# Patient Record
Sex: Female | Born: 2000 | Race: Black or African American | Hispanic: No | Marital: Single | Smoking: Current every day smoker
Health system: Southern US, Community
[De-identification: ages and names within clinical notes are randomized; demographics above are authoritative.]

## PROBLEM LIST (undated history)

## (undated) DIAGNOSIS — H539 Unspecified visual disturbance: Secondary | ICD-10-CM

## (undated) DIAGNOSIS — J45909 Unspecified asthma, uncomplicated: Secondary | ICD-10-CM

## (undated) DIAGNOSIS — J189 Pneumonia, unspecified organism: Secondary | ICD-10-CM

## (undated) DIAGNOSIS — J302 Other seasonal allergic rhinitis: Secondary | ICD-10-CM

## (undated) DIAGNOSIS — F909 Attention-deficit hyperactivity disorder, unspecified type: Secondary | ICD-10-CM

---
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
## 2022-01-22 DIAGNOSIS — Z419 Encounter for procedure for purposes other than remedying health state, unspecified: Secondary | ICD-10-CM | POA: Diagnosis not present

## 2022-01-29 ENCOUNTER — Ambulatory Visit
Admission: EM | Admit: 2022-01-29 | Discharge: 2022-01-29 | Disposition: A | Discharge status: Home / Self Care | Payer: Commercial Managed Care - PPO | Attending: Urgent Care | Admitting: Urgent Care

## 2022-01-29 DIAGNOSIS — N898 Other specified noninflammatory disorders of vagina: Secondary | ICD-10-CM | POA: Diagnosis not present

## 2022-01-29 DIAGNOSIS — Z20828 Contact with and (suspected) exposure to other viral communicable diseases: Secondary | ICD-10-CM | POA: Diagnosis not present

## 2022-01-29 DIAGNOSIS — Z202 Contact with and (suspected) exposure to infections with a predominantly sexual mode of transmission: Secondary | ICD-10-CM | POA: Diagnosis not present

## 2022-01-29 DIAGNOSIS — R300 Dysuria: Secondary | ICD-10-CM | POA: Insufficient documentation

## 2022-01-29 DIAGNOSIS — Z1152 Encounter for screening for COVID-19: Secondary | ICD-10-CM | POA: Insufficient documentation

## 2022-01-29 LAB — POCT URINALYSIS DIP (MANUAL ENTRY)
Bilirubin, UA: NEGATIVE
Glucose, UA: NEGATIVE mg/dL
Leukocytes, UA: NEGATIVE
Nitrite, UA: NEGATIVE
Spec Grav, UA: 1.025 (ref 1.010–1.025)
Urobilinogen, UA: 0.2 E.U./dL
pH, UA: 5.5 (ref 5.0–8.0)

## 2022-01-29 LAB — RESP PANEL BY RT-PCR (RSV, FLU A&B, COVID)  RVPGX2
Influenza A by PCR: NEGATIVE
Influenza B by PCR: NEGATIVE
Resp Syncytial Virus by PCR: NEGATIVE
SARS Coronavirus 2 by RT PCR: NEGATIVE

## 2022-01-29 NOTE — ED Provider Notes (Signed)
[name]    CSN: [id number] Arrival date & time: 01/29/22  0626      History   Chief Complaint Chief Complaint  Patient presents with   RSV/COVID/FLU testing    Vaginal Itching   vaginal odor    Dysuria    HPI Amy Mcclure is a 21 y.o. female.    Vaginal Itching  Dysuria   This to urgent care with complaint of painful urination, vaginal odor and itching x 2 weeks. Denies any vaginal discharge.  Patient also reports recent exposure to RSV and flu but endorsing no symptoms.  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      Home Medications    Prior to Admission medications   Medication Sig Start Date End Date Taking? Authorizing Provider  albuterol (PROVENTIL HFA;VENTOLIN HFA) 108 (90 BASE) MCG/ACT inhaler Inhale 2 puffs into the lungs  every 6 (six) hours as needed (seasonal allergies). 06/24/14   Chauncey Mann, MD  ARIPiprazole (ABILIFY) 10 MG tablet Take by mouth. 05/19/21 08/17/21  [provider]  beclomethasone (QVAR) 80 MCG/ACT inhaler Inhale 1 puff into the lungs 2 (two) times daily. 06/24/14   Chauncey Mann, MD  cetirizine (ZYRTEC) 10 MG tablet Take by mouth.    [provider]  desonide (DESOWEN) 0.05 % ointment Apply 1 application topically 2 (two) times daily. 06/24/14   Chauncey Mann, MD  FLOVENT HFA 44 MCG/ACT inhaler Inhale into the lungs. 08/21/21   [provider]  fluocinonide cream (LIDEX) 0.05 % Apply 1 application topically 2 (two) times daily as needed (eczema). 06/24/14   Chauncey Mann, MD  lithium carbonate (LITHOBID) 300 MG CR tablet Take 3 tablets (900 mg total) by mouth at bedtime. 06/24/14   Chauncey Mann, MD  polyethylene glycol (MIRALAX / GLYCOLAX) 17 g packet Take by mouth. 02/25/21   [provider]    Family History Family History  Problem Relation Age of Onset   Hypertension Mother    Miscarriages / [place] Mother    Asthma Father    Asthma Brother    Heart disease Maternal Aunt    Diabetes Maternal Aunt    Heart disease Maternal Uncle    Diabetes Paternal Aunt    Arthritis Maternal Grandmother    Heart disease Maternal Grandmother  Hypertension Paternal Grandmother    Diabetes Paternal Grandmother    Hypertension Paternal Grandfather     Social History Social History   Tobacco Use   Smoking status: Every Day    Types: Cigars   Smokeless tobacco: Never  Vaping Use   Vaping Use: Every day  Substance Use Topics   Alcohol use: No   Drug use: No     Allergies   Eggs or egg-derived products, Shellfish allergy, Cashew nut oil, Manganese, Milk-related compounds, Other, Penicillins, Barley grass, and Red dye   Review of Systems Review of Systems  Genitourinary:  Positive for dysuria.     Physical Exam Triage Vital  Signs ED Triage Vitals  Enc Vitals Group     BP 01/29/22 0843 115/73     Pulse Rate 01/29/22 0843 99     Resp 01/29/22 0843 18     Temp 01/29/22 0843 97.7 F (36.5 C)     Temp src --      SpO2 01/29/22 0843 98 %     Weight --      Height --      Head Circumference --      Peak Flow --      Pain Score 01/29/22 0846 0     Pain Loc --      Pain Edu? --      Excl. in [place]? --    No data found.  Updated Vital Signs BP 115/73   Pulse 99   Temp 97.7 F (36.5 C)   Resp 18   LMP 12/23/2021 (Approximate)   SpO2 98%   Visual Acuity Right Eye Distance:   Left Eye Distance:   Bilateral Distance:    Right Eye Near:   Left Eye Near:    Bilateral Near:     Physical Exam Vitals reviewed.  Constitutional:      Appearance: Normal appearance.  Skin:    General: Skin is warm and dry.  Neurological:     General: No focal deficit present.     Mental Status: She is alert and oriented to person, place, and time.  Psychiatric:        Mood and Affect: Mood normal.        Behavior: Behavior normal.      UC Treatments / Results  Labs (all labs ordered are listed, but only abnormal results are displayed) Labs Reviewed  POCT URINALYSIS DIP (MANUAL ENTRY)  CERVICOVAGINAL ANCILLARY ONLY    EKG   Radiology No results found.  Procedures Procedures (including critical care time)  Medications Ordered in UC Medications - No data to display  Initial Impression / Assessment and Plan / UC Course  I have reviewed the triage vital signs and the nursing notes.  Pertinent labs & imaging results that were available during my care of the patient were reviewed by me and considered in my medical decision making (see chart for details).   Vaginal swab is obtained and results are pending.  Patient is notified that she will receive results and possible treatment tomorrow at earliest.  UA indicates negative leukocytes or nitrites.  Small blood and ketones are present.  Will discharge  patient waiting on results of vaginal swab.   Final Clinical Impressions(s) / UC Diagnoses   Final diagnoses:  STD exposure   Discharge Instructions   None    ED Prescriptions   None    PDMP not reviewed this encounter.   Charma Igo, [place] 01/29/22 [phone]

## 2022-01-29 NOTE — ED Triage Notes (Signed)
Pt. Presents to UC w/ c/o dysuria, vaginal odor and vaginal itching for the past 2 weeks. Pt. Also states she was recently exposed to RSV and the FLU but endorses no symptoms.

## 2022-01-29 NOTE — Discharge Instructions (Signed)
Your urine does not indicate any signs of urinary tract infection.  The results of your swab will be posted to your MyChart account tomorrow.  If positive, someone will contact you to arrange treatment.

## 2022-02-01 ENCOUNTER — Telehealth (HOSPITAL_COMMUNITY): Payer: Self-pay | Admitting: Emergency Medicine

## 2022-02-01 LAB — CERVICOVAGINAL ANCILLARY ONLY
Bacterial Vaginitis (gardnerella): POSITIVE — AB
Candida Glabrata: NEGATIVE
Candida Vaginitis: NEGATIVE
Chlamydia: POSITIVE — AB
Comment: NEGATIVE
Comment: NEGATIVE
Comment: NEGATIVE
Comment: NEGATIVE
Comment: NEGATIVE
Comment: NORMAL
Neisseria Gonorrhea: NEGATIVE
Trichomonas: NEGATIVE

## 2022-02-01 MED ORDER — METRONIDAZOLE 500 MG PO TABS: 500.0000 mg | ORAL_TABLET | Freq: Two times a day (BID) | ORAL | 0 refills | Status: DC

## 2022-02-01 MED ORDER — DOXYCYCLINE HYCLATE 100 MG PO CAPS: 100.0000 mg | ORAL_CAPSULE | Freq: Two times a day (BID) | ORAL | 0 refills | Status: AC

## 2022-02-22 DIAGNOSIS — Z419 Encounter for procedure for purposes other than remedying health state, unspecified: Secondary | ICD-10-CM | POA: Diagnosis not present

## 2022-02-23 ENCOUNTER — Ambulatory Visit
Admission: EM | Admit: 2022-02-23 | Discharge: 2022-02-23 | Disposition: A | Discharge status: Home / Self Care | Payer: Commercial Managed Care - PPO | Attending: Nurse Practitioner | Admitting: Nurse Practitioner

## 2022-02-23 DIAGNOSIS — N760 Acute vaginitis: Secondary | ICD-10-CM | POA: Insufficient documentation

## 2022-02-23 DIAGNOSIS — R300 Dysuria: Secondary | ICD-10-CM | POA: Diagnosis not present

## 2022-02-23 DIAGNOSIS — N3000 Acute cystitis without hematuria: Secondary | ICD-10-CM | POA: Insufficient documentation

## 2022-02-23 LAB — POCT URINALYSIS DIP (MANUAL ENTRY)
Bilirubin, UA: NEGATIVE
Blood, UA: NEGATIVE
Glucose, UA: NEGATIVE mg/dL
Nitrite, UA: NEGATIVE
Spec Grav, UA: 1.025 (ref 1.010–1.025)
Urobilinogen, UA: 0.2 E.U./dL
pH, UA: 6.5 (ref 5.0–8.0)

## 2022-02-23 LAB — POCT URINE PREGNANCY: Preg Test, Ur: NEGATIVE

## 2022-02-23 MED ORDER — FLUCONAZOLE 150 MG PO TABS: 150.0000 mg | ORAL_TABLET | Freq: Every day | ORAL | 0 refills | Status: AC

## 2022-02-23 MED ORDER — NITROFURANTOIN MONOHYD MACRO 100 MG PO CAPS: 100.0000 mg | ORAL_CAPSULE | Freq: Two times a day (BID) | ORAL | 0 refills | Status: AC

## 2022-02-23 NOTE — Discharge Instructions (Signed)
Will contact you with results of the testing done today if the results are positive Start Macrobid twice a day for 7 days Diflucan as prescribed.  May repeat dose in 3 days if your symptoms do not improve Rest and fluids Follow-up with your PCP 2 to 3 days for recheck Please go to the emergency room if you have any worsening symptoms

## 2022-02-23 NOTE — ED Triage Notes (Signed)
Patient presents to [facility] for STD testing. She reports she tested positive for chlamydia and BV. States her symptoms have worsened after completing medications.  She c/o of dysuria, burning to rectum area, vaginal discharge with odor.

## 2022-02-23 NOTE — ED Provider Notes (Signed)
Roderic Palau    CSN: [id number] Arrival date & time: 02/23/22  1849      History   Chief Complaint Chief Complaint  Patient presents with   SEXUALLY TRANSMITTED DISEASE   Dysuria   Vaginal Discharge    HPI Amy Mcclure is a 22 y.o. female who presents for evaluation of dysuria and vaginal discharge. Patient was seen in urgent care on 12/8 for vaginal discharge.  She was positive for chlamydia and BV.  She was treated with doxycycline and metronidazole.  States she had 1 day of resolution of the symptoms and then developed vaginal discharge and dysuria.  Discharge is a malodorous, pruritic, thick white discharge.  Reports urinary burning but denies frequency, urgency, hematuria, fevers, nausea/vomiting, flank pain.  No STD or exposure.  Patient has a history of vaginal infections such as yeast infection or BV in the past. Patient has used nothing OTC medications for symptoms. Pt has no other concerns at this time.    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      Home Medications    Prior to Admission medications   Medication Sig Start Date End Date Taking? Authorizing Provider  fluconazole (DIFLUCAN) 150 MG tablet Take 1 tablet (150 mg total) by mouth daily for 2 doses. May repeat dose in 3 days if symptoms needed 02/23/22 02/25/22 Yes Melynda Ripple, NP  nitrofurantoin, macrocrystal-monohydrate, (MACROBID) 100 MG capsule Take 1 capsule (100 mg total) by mouth 2 (two) times daily for 7 days. 02/23/22 03/02/22 Yes Melynda Ripple, NP  albuterol (PROVENTIL HFA;VENTOLIN HFA) 108 (90 BASE) MCG/ACT inhaler Inhale 2 puffs into the lungs every 6 (six) hours as needed (seasonal allergies). 06/24/14   Delight Hoh, MD  ARIPiprazole (ABILIFY) 10 MG tablet Take by mouth. 05/19/21 08/17/21  [provider]  beclomethasone (QVAR) 80 MCG/ACT inhaler Inhale 1 puff into the lungs 2 (two) times daily. 06/24/14   Delight Hoh, MD  cetirizine (ZYRTEC) 10 MG tablet Take by mouth.    [provider]  desonide (DESOWEN) 0.05 % ointment Apply 1 application topically 2 (two) times daily. 06/24/14   Delight Hoh, MD  FLOVENT HFA 44 MCG/ACT inhaler Inhale into the lungs. 08/21/21   [provider]  fluocinonide cream (LIDEX) 4.78 % Apply 1 application topically 2 (two) times daily as needed (  eczema). 06/24/14   Chauncey Mann, MD  lithium carbonate (LITHOBID) 300 MG CR tablet Take 3 tablets (900 mg total) by mouth at bedtime. 06/24/14   Chauncey Mann, MD  metroNIDAZOLE (FLAGYL) 500 MG tablet Take 1 tablet (500 mg total) by mouth 2 (two) times daily. 02/01/22   Lamptey, Britta Mccreedy, MD  polyethylene glycol (MIRALAX / GLYCOLAX) 17 g packet Take by mouth. 02/25/21   [provider]    Family History Family History  Problem Relation Age of  Onset   Hypertension Mother    Miscarriages / [place] Mother    Asthma Father    Asthma Brother    Heart disease Maternal Aunt    Diabetes Maternal Aunt    Heart disease Maternal Uncle    Diabetes Paternal Aunt    Arthritis Maternal Grandmother    Heart disease Maternal Grandmother    Hypertension Paternal Grandmother    Diabetes Paternal Grandmother    Hypertension Paternal Grandfather     Social History Social History   Tobacco Use   Smoking status: Every Day    Types: Cigars   Smokeless tobacco: Never  Vaping Use   Vaping Use: Every day  Substance Use Topics   Alcohol use: No   Drug use: No     Allergies   Eggs or egg-derived products, Shellfish allergy, Cashew nut oil, Manganese, Milk-related compounds, Other, Penicillins, Barley grass, and Red dye   Review of Systems Review of Systems  Genitourinary:  Positive for dysuria and vaginal discharge.     Physical Exam Triage Vital Signs ED Triage Vitals [02/23/22 1912]  Enc Vitals Group     BP      Pulse      Resp      Temp      Temp src      SpO2      Weight      Height      Head Circumference      Peak Flow      Pain Score 0     Pain Loc      Pain Edu?      Excl. in [place]?    No data found.  Updated Vital Signs BP 113/76 (BP Location: Left Arm)   Pulse 94   Temp 98.9 F (37.2 C) (Oral)   Resp 16   LMP 12/23/2021 (Approximate) Comment: patient is on birth control  SpO2 97%   Visual Acuity Right Eye Distance:   Left Eye Distance:   Bilateral Distance:    Right Eye Near:   Left Eye Near:    Bilateral Near:     Physical Exam Vitals and nursing note reviewed.  Constitutional:      Appearance: Normal appearance.  HENT:     Head: Normocephalic and atraumatic.  Eyes:     Pupils: Pupils are equal, round, and reactive to light.  Cardiovascular:     Rate and Rhythm: Normal rate.  Pulmonary:     Effort: Pulmonary effort is normal.  Abdominal:     Tenderness: There is no right CVA  tenderness or left CVA tenderness.  Skin:    General: Skin is warm and dry.  Neurological:     General: No focal deficit present.     Mental Status: She is alert and oriented to person, place, and time.  Psychiatric:        Mood and Affect: Mood normal.        Behavior: Behavior normal.  UC Treatments / Results  Labs (all labs ordered are listed, but only abnormal results are displayed) Labs Reviewed  POCT URINALYSIS DIP (MANUAL ENTRY) - Abnormal; Notable for the following components:      Result Value   Clarity, UA cloudy (*)    Ketones, POC UA trace (5) (*)    Protein Ur, POC trace (*)    Leukocytes, UA Trace (*)    All other components within normal limits  URINE CULTURE  POCT URINE PREGNANCY  CERVICOVAGINAL ANCILLARY ONLY    EKG   Radiology No results found.  Procedures Procedures (including critical care time)  Medications Ordered in UC Medications - No data to display  Initial Impression / Assessment and Plan / UC Course  I have reviewed the triage vital signs and the nursing notes.  Pertinent labs & imaging results that were available during my care of the patient were reviewed by me and considered in my medical decision making (see chart for details).     Reviewed exam and symptoms with patient. Start Macrobid and send urine culture STD testing is ordered. Start Diflucan Rest and fluids PCP follow-up 2 to 3 days for recheck ER precautions reviewed and patient verbalized understanding  Final Clinical Impressions(s) / UC Diagnoses   Final diagnoses:  Acute vaginitis  Dysuria  Acute cystitis without hematuria     Discharge Instructions      Will contact you with results of the testing done today if the results are positive Start Macrobid twice a day for 7 days Diflucan as prescribed.  May repeat dose in 3 days if your symptoms do not improve Rest and fluids Follow-up with your PCP 2 to 3 days for recheck Please go to the emergency room  if you have any worsening symptoms     ED Prescriptions     Medication Sig Dispense Auth. Provider   fluconazole (DIFLUCAN) 150 MG tablet Take 1 tablet (150 mg total) by mouth daily for 2 doses. May repeat dose in 3 days if symptoms needed 2 tablet Melynda Ripple, NP   nitrofurantoin, macrocrystal-monohydrate, (MACROBID) 100 MG capsule Take 1 capsule (100 mg total) by mouth 2 (two) times daily for 7 days. 14 capsule Melynda Ripple, NP      PDMP not reviewed this encounter.   Melynda Ripple, NP 02/23/22 1949

## 2022-02-24 LAB — CERVICOVAGINAL ANCILLARY ONLY
Bacterial Vaginitis (gardnerella): POSITIVE — AB
Candida Glabrata: NEGATIVE
Candida Vaginitis: POSITIVE — AB
Chlamydia: NEGATIVE
Comment: NEGATIVE
Comment: NEGATIVE
Comment: NEGATIVE
Comment: NEGATIVE
Comment: NEGATIVE
Comment: NORMAL
Neisseria Gonorrhea: NEGATIVE
Trichomonas: NEGATIVE

## 2022-02-25 ENCOUNTER — Telehealth (HOSPITAL_COMMUNITY): Payer: Self-pay | Admitting: Emergency Medicine

## 2022-02-25 LAB — URINE CULTURE

## 2022-02-25 MED ORDER — METRONIDAZOLE 0.75 % VA GEL: 1.0000 | Freq: Every day | VAGINAL | 0 refills | Status: AC

## 2022-02-27 ENCOUNTER — Other Ambulatory Visit: Payer: Self-pay

## 2022-02-27 ENCOUNTER — Encounter: Payer: Self-pay | Admitting: Emergency Medicine

## 2022-02-27 DIAGNOSIS — R112 Nausea with vomiting, unspecified: Secondary | ICD-10-CM | POA: Diagnosis not present

## 2022-02-27 DIAGNOSIS — Z1152 Encounter for screening for COVID-19: Secondary | ICD-10-CM | POA: Diagnosis not present

## 2022-02-27 DIAGNOSIS — R197 Diarrhea, unspecified: Secondary | ICD-10-CM | POA: Insufficient documentation

## 2022-02-27 DIAGNOSIS — J45909 Unspecified asthma, uncomplicated: Secondary | ICD-10-CM | POA: Insufficient documentation

## 2022-02-27 DIAGNOSIS — R109 Unspecified abdominal pain: Secondary | ICD-10-CM | POA: Insufficient documentation

## 2022-02-27 LAB — URINALYSIS, ROUTINE W REFLEX MICROSCOPIC
Bacteria, UA: NONE SEEN
Bilirubin Urine: NEGATIVE
Glucose, UA: NEGATIVE mg/dL
Ketones, ur: 20 mg/dL — AB
Leukocytes,Ua: NEGATIVE
Nitrite: NEGATIVE
Protein, ur: 30 mg/dL — AB
Specific Gravity, Urine: 1.021 (ref 1.005–1.030)
pH: 5 (ref 5.0–8.0)

## 2022-02-27 LAB — COMPREHENSIVE METABOLIC PANEL
ALT: 17 U/L (ref 0–44)
AST: 20 U/L (ref 15–41)
Albumin: 3.9 g/dL (ref 3.5–5.0)
Alkaline Phosphatase: 69 U/L (ref 38–126)
Anion gap: 10 (ref 5–15)
BUN: 9 mg/dL (ref 6–20)
CO2: 21 mmol/L — ABNORMAL LOW (ref 22–32)
Calcium: 9.1 mg/dL (ref 8.9–10.3)
Chloride: 102 mmol/L (ref 98–111)
Creatinine, Ser: 0.88 mg/dL (ref 0.44–1.00)
GFR, Estimated: >60 mL/min (ref >60)
Glucose, Bld: 92 mg/dL (ref 70–99)
Potassium: 3.3 mmol/L — ABNORMAL LOW (ref 3.5–5.1)
Sodium: 133 mmol/L — ABNORMAL LOW (ref 135–145)
Total Bilirubin: 0.8 mg/dL (ref 0.3–1.2)
Total Protein: 8.2 g/dL — ABNORMAL HIGH (ref 6.5–8.1)

## 2022-02-27 LAB — LIPASE, BLOOD: Lipase: 27 U/L (ref 11–51)

## 2022-02-27 LAB — RESP PANEL BY RT-PCR (RSV, FLU A&B, COVID)  RVPGX2
Influenza A by PCR: NEGATIVE
Influenza B by PCR: NEGATIVE
Resp Syncytial Virus by PCR: NEGATIVE
SARS Coronavirus 2 by RT PCR: NEGATIVE

## 2022-02-27 LAB — CBC
HCT: 41.5 % (ref 36.0–46.0)
Hemoglobin: 13.3 g/dL (ref 12.0–15.0)
MCH: 28.2 pg (ref 26.0–34.0)
MCHC: 32 g/dL (ref 30.0–36.0)
MCV: 88.1 fL (ref 80.0–100.0)
Platelets: 303 10*3/uL (ref 150–400)
RBC: 4.71 MIL/uL (ref 3.87–5.11)
RDW: 12.4 % (ref 11.5–15.5)
WBC: 4.2 10*3/uL (ref 4.0–10.5)
nRBC: 0 % (ref 0.0–0.2)

## 2022-02-27 LAB — POC URINE PREG, ED: Preg Test, Ur: NEGATIVE

## 2022-02-27 NOTE — ED Triage Notes (Signed)
Pt c/o NVD with abdominal cramping.  Sx started last night.  Pt reports low grade temps of 99.7.  has not taken any OTC meds.  Ambulatory without difficulty.  VSS.  Has also had cough and congestion.  Reports multiple episodes of vomiting today.

## 2022-02-28 ENCOUNTER — Emergency Department
Admission: EM | Admit: 2022-02-28 | Discharge: 2022-02-28 | Disposition: A | Discharge status: Home / Self Care | Payer: Commercial Managed Care - PPO | Attending: Emergency Medicine | Admitting: Emergency Medicine

## 2022-02-28 DIAGNOSIS — R112 Nausea with vomiting, unspecified: Secondary | ICD-10-CM | POA: Diagnosis not present

## 2022-02-28 MED ORDER — KETOROLAC TROMETHAMINE 30 MG/ML IJ SOLN
30.0000 mg | Freq: Once | INTRAMUSCULAR | Status: AC
  Administered 2022-02-28: 30 mg via INTRAVENOUS
  Filled 2022-02-28: qty 1

## 2022-02-28 MED ORDER — SODIUM CHLORIDE 0.9 % IV BOLUS (SEPSIS)
1000.0000 mL | Freq: Once | INTRAVENOUS | Status: AC
  Administered 2022-02-28: 1000 mL via INTRAVENOUS

## 2022-02-28 MED ORDER — ONDANSETRON HCL 4 MG/2ML IJ SOLN
4.0000 mg | Freq: Once | INTRAMUSCULAR | Status: AC
  Administered 2022-02-28: 4 mg via INTRAVENOUS
  Filled 2022-02-28: qty 2

## 2022-02-28 MED ORDER — ACETAMINOPHEN 500 MG PO TABS
1000.0000 mg | ORAL_TABLET | Freq: Once | ORAL | Status: AC
  Administered 2022-02-28: 1000 mg via ORAL
  Filled 2022-02-28: qty 2

## 2022-02-28 MED ORDER — POTASSIUM CHLORIDE CRYS ER 20 MEQ PO TBCR
40.0000 meq | EXTENDED_RELEASE_TABLET | Freq: Once | ORAL | Status: AC
  Administered 2022-02-28: 40 meq via ORAL
  Filled 2022-02-28: qty 2

## 2022-02-28 MED ORDER — DICYCLOMINE HCL 20 MG PO TABS: 20.0000 mg | ORAL_TABLET | Freq: Three times a day (TID) | ORAL | 0 refills | Status: DC | PRN

## 2022-02-28 MED ORDER — LOPERAMIDE HCL 2 MG PO CAPS
2.0000 mg | ORAL_CAPSULE | Freq: Once | ORAL | Status: AC
  Administered 2022-02-28: 2 mg via ORAL
  Filled 2022-02-28: qty 1

## 2022-02-28 MED ORDER — ONDANSETRON 4 MG PO TBDP: 4.0000 mg | ORAL_TABLET | Freq: Four times a day (QID) | ORAL | 0 refills | Status: DC | PRN

## 2022-02-28 NOTE — Discharge Instructions (Signed)
You may alternate Tylenol 1000 mg every 6 hours as needed for pain, fever and Ibuprofen 800 mg every 6-8 hours as needed for pain, fever.  Please take Ibuprofen with food.  Do not take more than 4000 mg of Tylenol (acetaminophen) in a 24 hour period.   Please go to the following website to schedule new (and existing) patient appointments:   [email or web address]   The following is a list of primary care offices in the area who are accepting new patients at this time.  Please reach out to one of them directly and let them know you would like to schedule an appointment to follow up on an Emergency Department visit, and/or to establish a new primary care provider (PCP).  There are likely other primary care clinics in the are who are accepting new patients, but this is an excellent place to start:  [facility] Lead physician: Dr Angela Bacigalupo [address] [place], [place] [address] [phone]  [facility] Lead Physician: Dr Krichna Sowles [address], [place], [place] [address] [phone]  [facility]  Lead Physician: Dr Megan Johnson [address], [place], [place] [address] [phone]  [facility] [place] [facility] Lead Physician: Dr Alex Karamalegos [address], [place], [place] [address] [phone]  [facility] [place] Lead Physician: Dr Laura Berglund [address], [place], [place] [address] [phone]  

## 2022-02-28 NOTE — ED Provider Notes (Signed)
[facility] Provider Note    Event Date/Time   First MD Initiated Contact with Patient 02/28/22 0244     (approximate)   History   Abdominal Pain   HPI  Amy Mcclure is a 22 y.o. female with history of asthma, ADHD, bipolar, iron deficiency anemia who presents to the emergency department with her mother for complaints of nausea, vomiting and diarrhea that started 2 days ago.  Mother and brother also have similar symptoms.  She has had subjective fevers and chills.  No urinary symptoms.  No previous abdominal surgery other than C-section.   History provided by patient.    Past Medical History:  Diagnosis Date   ADHD (attention deficit hyperactivity disorder)    Asthma    Seasonal allergies    Vision abnormalities     Past Surgical History:  Procedure Laterality Date   CESAREAN SECTION  02/22/2021    MEDICATIONS:  Prior to Admission medications   Medication Sig Start Date End Date Taking? Authorizing Provider  albuterol (PROVENTIL HFA;VENTOLIN HFA) 108 (90 BASE) MCG/ACT inhaler Inhale 2 puffs into the lungs every 6 (six) hours as needed (seasonal allergies). 06/24/14   Delight Hoh, MD  ARIPiprazole (ABILIFY) 10 MG tablet Take by mouth. 05/19/21 08/17/21  [provider]  beclomethasone (QVAR) 80 MCG/ACT inhaler Inhale 1 puff into the lungs 2 (two) times daily. 06/24/14   Delight Hoh, MD  cetirizine (ZYRTEC) 10 MG tablet Take by mouth.    [provider]  desonide (DESOWEN) 0.05 % ointment Apply 1 application topically 2 (two) times daily. 06/24/14   Delight Hoh, MD  FLOVENT HFA 44 MCG/ACT inhaler Inhale into the lungs. 08/21/21   [provider]  fluocinonide cream (LIDEX) 2.95 % Apply 1 application topically 2 (two) times daily as needed (eczema). 06/24/14   Delight Hoh, MD  lithium carbonate (LITHOBID) 300 MG CR tablet Take 3 tablets (900 mg total) by mouth at bedtime. 06/24/14   Delight Hoh, MD   metroNIDAZOLE (FLAGYL) 500 MG tablet Take 1 tablet (500 mg total) by mouth 2 (two) times daily. 02/01/22   LampteyMyrene Galas, MD  metroNIDAZOLE (METROGEL) 0.75 % vaginal gel Place 1 Applicatorful vaginally at bedtime for 5 days. 02/25/22 03/02/22  LampteyMyrene Galas, MD  nitrofurantoin, macrocrystal-monohydrate, (MACROBID) 100 MG capsule Take 1 capsule (100 mg total) by mouth 2 (two) times daily for 7 days. 02/23/22 03/02/22  Melynda Ripple, NP  polyethylene glycol (MIRALAX / GLYCOLAX) 17 g packet Take by mouth. 02/25/21   [provider]    Physical Exam   Triage Vital Signs: ED Triage Vitals  Enc Vitals Group     BP 02/27/22 2125 120/74     Pulse Rate 02/27/22 2125 (!) 102     Resp 02/27/22 2125 18     Temp 02/27/22 2125 99.2 F (37.3 C)     Temp Source 02/27/22 2125 Oral     SpO2 02/27/22 2125 96 %     Weight 02/27/22 2126 216 lb (98 kg)     Height 02/27/22 2126 5\' 2"  (1.575 m)     Head Circumference --      Peak Flow --      Pain Score 02/27/22 2126 4     Pain Loc --      Pain Edu? --      Excl. in [place]? --     Most recent vital signs: Vitals:   02/27/22 2125 02/28/22 0200  BP: 120/74 115/74  Pulse: (!) 102 (!) 101  Resp: 18 18  Temp: 99.2 F (37.3 C) 99.7 F (37.6 C)  SpO2: 96% 99%    CONSTITUTIONAL: Alert and oriented and responds appropriately to questions. Well-appearing; well-nourished HEAD: Normocephalic, atraumatic EYES: Conjunctivae clear, pupils appear equal, sclera nonicteric ENT: normal nose; moist mucous membranes NECK: Supple, normal ROM CARD: RRR; S1 and S2 appreciated; no murmurs, no clicks, no rubs, no gallops RESP: Normal chest excursion without splinting or tachypnea; breath sounds clear and equal bilaterally; no wheezes, no rhonchi, no rales, no hypoxia or respiratory distress, speaking full sentences ABD/GI: Normal bowel sounds; non-distended; soft, non-tender, no rebound, no guarding, no peritoneal signs BACK: The back appears normal EXT: Normal  ROM in all joints; no deformity noted, no edema; no cyanosis SKIN: Normal color for age and race; warm; no rash on exposed skin NEURO: Moves all extremities equally, normal speech PSYCH: The patient's mood and manner are appropriate.   ED Results / Procedures / Treatments   LABS: (all labs ordered are listed, but only abnormal results are displayed) Labs Reviewed  COMPREHENSIVE METABOLIC PANEL - Abnormal; Notable for the following components:      Result Value   Sodium 133 (*)    Potassium 3.3 (*)    CO2 21 (*)    Total Protein 8.2 (*)    All other components within normal limits  URINALYSIS, ROUTINE W REFLEX MICROSCOPIC - Abnormal; Notable for the following components:   Color, Urine YELLOW (*)    APPearance CLOUDY (*)    Hgb urine dipstick LARGE (*)    Ketones, ur 20 (*)    Protein, ur 30 (*)    All other components within normal limits  RESP PANEL BY RT-PCR (RSV, FLU A&B, COVID)  RVPGX2  LIPASE, BLOOD  CBC  POC URINE PREG, ED     EKG:  RADIOLOGY: My personal review and interpretation of imaging:    I have personally reviewed all radiology reports.   No results found.   PROCEDURES:  Critical Care performed: No      Procedures    IMPRESSION / MDM / ASSESSMENT AND PLAN / ED COURSE  I reviewed the triage vital signs and the nursing notes.    Patient here with vomiting and diarrhea.    DIFFERENTIAL DIAGNOSIS (includes but not limited to):   Viral gastroenteritis, low suspicion for appendicitis, cholecystitis, colitis, diverticulitis, bowel obstruction   Patient's presentation is most consistent with acute illness / injury with system symptoms.   PLAN: Workup initiated from triage.  No leukocytosis, normal hemoglobin.  Potassium of 3.3.  Will give replacement.  Normal renal function, LFTs and lipase.  Urine shows some red blood cells and white blood cells but no bacteria and also many squamous cells.  Suspect this is a contaminated sample.  She is not  having any urinary symptoms.  Pregnancy test negative.  Abdominal exam benign.  No indication that she needs emergent abdominal imaging today.  Will give IV medications for symptomatic treatment and p.o. challenge.   MEDICATIONS GIVEN IN ED: Medications  sodium chloride 0.9 % bolus 1,000 mL (has no administration in time range)  ketorolac (TORADOL) 30 MG/ML injection 30 mg (has no administration in time range)  ondansetron (ZOFRAN) injection 4 mg (has no administration in time range)  loperamide (IMODIUM) capsule 2 mg (has no administration in time range)  potassium chloride SA (KLOR-CON M) CR tablet 40 mEq (has no administration in time range)  acetaminophen (TYLENOL)  tablet 1,000 mg (has no administration in time range)     ED COURSE: Patient tolerating p.o. here and feeling better requesting discharge home with work note.  Will discharge with Zofran, Bentyl and instructions to alternate Tylenol and Motrin.  Commended bland diet.   At this time, I do not feel there is any life-threatening condition present. I reviewed all nursing notes, vitals, pertinent previous records.  All lab and urine results, EKGs, imaging ordered have been independently reviewed and interpreted by myself.  I reviewed all available radiology reports from any imaging ordered this visit.  Based on my assessment, I feel the patient is safe to be discharged home without further emergent workup and can continue workup as an outpatient as needed. Discussed all findings, treatment plan as well as usual and customary return precautions.  They verbalize understanding and are comfortable with this plan.  Outpatient follow-up has been provided as needed.  All questions have been answered.    CONSULTS:  none   OUTSIDE RECORDS REVIEWED: Reviewed patient's last office visit with pediatrics on 09/29/2021.       FINAL CLINICAL IMPRESSION(S) / ED DIAGNOSES   Final diagnoses:  None     Rx / DC Orders   ED Discharge  Orders     None        Note:  This document was prepared using Dragon voice recognition software and may include unintentional dictation errors.   Gage Weant, Layla Maw, DO 02/28/22 [phone]

## 2022-03-25 DIAGNOSIS — Z419 Encounter for procedure for purposes other than remedying health state, unspecified: Secondary | ICD-10-CM | POA: Diagnosis not present

## 2022-04-23 DIAGNOSIS — Z419 Encounter for procedure for purposes other than remedying health state, unspecified: Secondary | ICD-10-CM | POA: Diagnosis not present

## 2022-05-24 DIAGNOSIS — Z419 Encounter for procedure for purposes other than remedying health state, unspecified: Secondary | ICD-10-CM | POA: Diagnosis not present

## 2022-06-21 ENCOUNTER — Ambulatory Visit
Admission: EM | Admit: 2022-06-21 | Discharge: 2022-06-21 | Disposition: A | Discharge status: Home / Self Care | Payer: Medicaid Other | Attending: Emergency Medicine | Admitting: Emergency Medicine

## 2022-06-21 DIAGNOSIS — Z3202 Encounter for pregnancy test, result negative: Secondary | ICD-10-CM | POA: Diagnosis not present

## 2022-06-21 DIAGNOSIS — R110 Nausea: Secondary | ICD-10-CM | POA: Diagnosis not present

## 2022-06-21 DIAGNOSIS — Z113 Encounter for screening for infections with a predominantly sexual mode of transmission: Secondary | ICD-10-CM | POA: Diagnosis not present

## 2022-06-21 LAB — POCT URINE PREGNANCY: Preg Test, Ur: NEGATIVE

## 2022-06-21 NOTE — ED Triage Notes (Addendum)
Patient to Urgent Care for pregnancy test. LMP approximately 05/24/22 (has IUD in place) . States her periods have been irregular and painful- reports they typically they are 3-4 days with no cramps. Has also noticed some spotting between periods. Also reports nausea, dry-heaving but no emesis.

## 2022-06-21 NOTE — Discharge Instructions (Addendum)
Your pregnancy test is negative.   Establish a primary care provider.

## 2022-06-21 NOTE — ED Provider Notes (Signed)
[name]    CSN: [id number] Arrival date & time: 06/21/22  0920      History   Chief Complaint Chief Complaint  Patient presents with   Possible Pregnancy    HPI Amy Mcclure is a 22 y.o. female.  Presents with request for pregnancy test and STD testing.  She reports nausea and irregular menstrual cycle.  She has an IUD.  LMP 05/24/2022.  No fever, abdominal pain, vomiting, diarrhea, dysuria, vaginal discharge, pelvic pain, or other symptoms.  Her medical history includes morbid obesity, asthma, allergies, ADHD, bipolar 1 disorder. The history is provided by the patient and medical records.    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      Home Medications    Prior to Admission medications   Medication Sig Start Date End Date Taking? Authorizing Provider  albuterol (PROVENTIL HFA;VENTOLIN HFA) 108 (90 BASE) MCG/ACT inhaler Inhale 2 puffs into the lungs every 6 (six)  hours as needed (seasonal allergies). 06/24/14   Chauncey Mann, MD  ARIPiprazole (ABILIFY) 10 MG tablet Take by mouth. Patient not taking: Reported on 06/21/2022 05/19/21 08/17/21  [provider]  beclomethasone (QVAR) 80 MCG/ACT inhaler Inhale 1 puff into the lungs 2 (two) times daily. 06/24/14   Chauncey Mann, MD  cetirizine (ZYRTEC) 10 MG tablet Take by mouth.    [provider]  desonide (DESOWEN) 0.05 % ointment Apply 1 application topically 2 (two) times daily. Patient not taking: Reported on 06/21/2022 06/24/14   Chauncey Mann, MD  dicyclomine (BENTYL) 20 MG tablet Take 1 tablet (20 mg total) by mouth every 8 (eight) hours as needed for spasms (Abdominal cramping). Patient not taking: Reported on 06/21/2022 02/28/22   Ward, Layla Maw, DO  FLOVENT HFA 44 MCG/ACT inhaler Inhale into the lungs. 08/21/21   [provider]  fluocinonide cream (LIDEX) 0.05 % Apply 1 application topically 2 (two) times daily as needed (eczema). Patient not taking: Reported on 06/21/2022 06/24/14   Chauncey Mann, MD  lithium carbonate (LITHOBID) 300 MG CR tablet Take 3 tablets (900 mg total) by mouth at bedtime. Patient not taking: Reported on 06/21/2022 06/24/14   Chauncey Mann, MD  metroNIDAZOLE (FLAGYL) 500 MG tablet Take 1 tablet (500 mg total) by mouth 2 (two) times daily. Patient not taking: Reported on 06/21/2022 02/01/22   Merrilee Jansky, MD  ondansetron (ZOFRAN-ODT) 4 MG disintegrating tablet Take 1 tablet (4 mg total) by mouth every  6 (six) hours as needed for nausea or vomiting. 02/28/22   Ward, Layla Maw, DO  polyethylene glycol (MIRALAX / GLYCOLAX) 17 g packet Take by mouth. 02/25/21   [provider]    Family History Family History  Problem Relation Age of Onset   Hypertension Mother    Miscarriages / [place] Mother    Asthma Father    Asthma Brother    Heart disease Maternal Aunt    Diabetes Maternal Aunt    Heart disease Maternal Uncle    Diabetes  Paternal Aunt    Arthritis Maternal Grandmother    Heart disease Maternal Grandmother    Hypertension Paternal Grandmother    Diabetes Paternal Grandmother    Hypertension Paternal Grandfather     Social History Social History   Tobacco Use   Smoking status: Every Day    Types: Cigarettes   Smokeless tobacco: Never  Vaping Use   Vaping Use: Every day  Substance Use Topics   Alcohol use: No   Drug use: Yes    Types: Marijuana    Comment: Smoke     Allergies   Egg-derived products, Shellfish allergy, Cashew nut oil, Manganese, Milk-related compounds, Other, Penicillins, Barley grass, and Red dye   Review of Systems Review of Systems  Constitutional:  Negative for chills and fever.  Gastrointestinal:  Positive for nausea. Negative for abdominal pain, diarrhea and vomiting.  Genitourinary:  Negative for dysuria, flank pain, frequency, hematuria, pelvic pain and vaginal discharge.  Skin:  Negative for rash.  All other systems reviewed and are negative.    Physical Exam Triage Vital Signs ED Triage Vitals  Enc Vitals Group     BP 06/21/22 1041 119/77     Pulse Rate 06/21/22 1030 80     Resp 06/21/22 1030 18     Temp 06/21/22 1030 97.7 F (36.5 C)     Temp src --      SpO2 06/21/22 1030 98 %     Weight --      Height --      Head Circumference --      Peak Flow --      Pain Score 06/21/22 1037 0     Pain Loc --      Pain Edu? --      Excl. in GC? --    No data found.  Updated Vital Signs BP 119/77   Pulse 80   Temp 97.7 F (36.5 C)   Resp 18   LMP 05/24/2022   SpO2 98%   Visual Acuity Right Eye Distance:   Left Eye Distance:   Bilateral Distance:    Right Eye Near:   Left Eye Near:    Bilateral Near:     Physical Exam Vitals and nursing note reviewed.  Constitutional:      General: She is not in acute distress.    Appearance: She is well-developed. She is obese. She is not ill-appearing.  HENT:     Mouth/Throat:     Mouth: Mucous  membranes are moist.  Cardiovascular:     Rate and Rhythm: Normal rate and regular rhythm.     Heart sounds: Normal heart sounds.  Pulmonary:     Effort: Pulmonary effort is normal. No respiratory distress.     Breath sounds: Normal breath sounds.  Abdominal:     General: Bowel sounds are normal.     Palpations: Abdomen is soft.     Tenderness: There is no abdominal tenderness. There is  no right CVA tenderness, left CVA tenderness, guarding or rebound.  Musculoskeletal:     Cervical back: Neck supple.  Skin:    General: Skin is warm and dry.  Neurological:     Mental Status: She is alert.  Psychiatric:        Mood and Affect: Mood normal.        Behavior: Behavior normal.      UC Treatments / Results  Labs (all labs ordered are listed, but only abnormal results are displayed) Labs Reviewed  POCT URINE PREGNANCY  CERVICOVAGINAL ANCILLARY ONLY    EKG   Radiology No results found.  Procedures Procedures (including critical care time)  Medications Ordered in UC Medications - No data to display  Initial Impression / Assessment and Plan / UC Course  I have reviewed the triage vital signs and the nursing notes.  Pertinent labs & imaging results that were available during my care of the patient were reviewed by me and considered in my medical decision making (see chart for details).    Negative pregnancy test, nausea, STD screening.  Urine pregnancy test is negative.  Patient obtained vaginal self swab for testing.  Discussed that we will call if test results are positive.  Discussed that she may require treatment at that time.  Discussed that sexual partner(s) may also require treatment.  Instructed patient to abstain from sexual activity for at least 7 days.  Instructed her to follow-up with her PCP or gynecologist if her symptoms are not improving.  Patient agrees to plan of care.   Final Clinical Impressions(s) / UC Diagnoses   Final diagnoses:  Negative pregnancy  test  Nausea without vomiting  Screening for STD (sexually transmitted disease)     Discharge Instructions      Your pregnancy test is negative.   Establish a primary care provider.       ED Prescriptions   None    PDMP not reviewed this encounter.   Mickie Bail, NP 06/21/22 1125

## 2022-06-21 NOTE — ED Notes (Signed)
Appointment made for patient to establish PCP. Appointment on Tuesday May 14th @12 :45 at [facility]. Paper reminder provided for patient.

## 2022-06-21 NOTE — ED Notes (Signed)
Requested STD testing prior to discharge. Denies any current symptoms.

## 2022-06-22 ENCOUNTER — Telehealth (HOSPITAL_COMMUNITY): Payer: Self-pay | Admitting: Emergency Medicine

## 2022-06-22 LAB — CERVICOVAGINAL ANCILLARY ONLY
Bacterial Vaginitis (gardnerella): POSITIVE — AB
Candida Glabrata: NEGATIVE
Candida Vaginitis: NEGATIVE
Chlamydia: NEGATIVE
Comment: NEGATIVE
Comment: NEGATIVE
Comment: NEGATIVE
Comment: NEGATIVE
Comment: NEGATIVE
Comment: NORMAL
Neisseria Gonorrhea: NEGATIVE
Trichomonas: NEGATIVE

## 2022-06-22 MED ORDER — METRONIDAZOLE 500 MG PO TABS: 500.0000 mg | ORAL_TABLET | Freq: Two times a day (BID) | ORAL | 0 refills | Status: DC

## 2022-06-23 DIAGNOSIS — Z419 Encounter for procedure for purposes other than remedying health state, unspecified: Secondary | ICD-10-CM | POA: Diagnosis not present

## 2022-07-06 ENCOUNTER — Encounter: Payer: Self-pay | Admitting: Family Medicine

## 2022-07-06 ENCOUNTER — Ambulatory Visit: Payer: BC Managed Care – PPO | Admitting: Family Medicine

## 2022-07-06 VITALS — BP 112/68 | HR 82 | Temp 98.0°F | Ht 62.0 in | Wt 193.6 lb

## 2022-07-06 DIAGNOSIS — Z0000 Encounter for general adult medical examination without abnormal findings: Secondary | ICD-10-CM | POA: Diagnosis not present

## 2022-07-06 DIAGNOSIS — Z1159 Encounter for screening for other viral diseases: Secondary | ICD-10-CM | POA: Diagnosis not present

## 2022-07-06 DIAGNOSIS — Z114 Encounter for screening for human immunodeficiency virus [HIV]: Secondary | ICD-10-CM | POA: Diagnosis not present

## 2022-07-06 DIAGNOSIS — Z8659 Personal history of other mental and behavioral disorders: Secondary | ICD-10-CM | POA: Diagnosis not present

## 2022-07-06 DIAGNOSIS — N912 Amenorrhea, unspecified: Secondary | ICD-10-CM

## 2022-07-06 LAB — URINALYSIS, ROUTINE W REFLEX MICROSCOPIC
Bilirubin Urine: NEGATIVE
Ketones, ur: NEGATIVE
Leukocytes,Ua: NEGATIVE
Nitrite: NEGATIVE
Specific Gravity, Urine: 1.02 (ref 1.000–1.030)
Total Protein, Urine: NEGATIVE
Urine Glucose: NEGATIVE
Urobilinogen, UA: 0.2 (ref 0.0–1.0)
pH: 7.5 (ref 5.0–8.0)

## 2022-07-06 NOTE — Progress Notes (Signed)
New Patient Office Visit  Subjective    Patient ID: Amy Mcclure, female    DOB: Jun 23, 2000  Age: 22 y.o. MRN: [id number]  CC:  Chief Complaint  Patient presents with   Establish Care    NP/establish care patient would like blood testing for pregnancy. Patient fasting.     HPI Amy Mcclure presents to establish care. Encounter Diagnoses  Name Primary?   Amenorrhea Yes   Healthcare maintenance    Encounter for hepatitis C screening test for low risk patient    Screening for HIV (human immunodeficiency virus)    History of bipolar disorder    22 year old female in for health check.  She is a G2P1011 whose last menstrual period was on 4 /1/24.  She does have an IUD in place.  She feels as though she has symptoms of pregnancy including breast fullness, morning nausea and odor aversion.  Urine pregnancy is have been negative.  History of bipolar disease.  To date she has not done well with medications or counseling.  She lives at home with her 71-month-old son, younger brother and mother.  She is working in an assisted living facility.  Outpatient Encounter Medications as of 07/06/2022  Medication Sig   albuterol (PROVENTIL HFA;VENTOLIN HFA) 108 (90 BASE) MCG/ACT inhaler Inhale 2 puffs into the lungs every 6 (six) hours as needed (seasonal allergies).   beclomethasone (QVAR) 80 MCG/ACT inhaler Inhale 1 puff into the lungs 2 (two) times daily.   cetirizine (ZYRTEC) 10 MG tablet Take by mouth.   FLOVENT HFA 44 MCG/ACT inhaler Inhale into the lungs.   polyethylene glycol (MIRALAX / GLYCOLAX) 17 g packet Take by mouth.   [DISCONTINUED] ARIPiprazole (ABILIFY) 10 MG tablet Take by mouth. (Patient not taking: Reported on 06/21/2022)   [DISCONTINUED] desonide (DESOWEN) 0.05 % ointment Apply 1 application topically 2 (two) times daily. (Patient not taking: Reported on 06/21/2022)   [DISCONTINUED] dicyclomine (BENTYL) 20 MG tablet Take 1 tablet (20 mg total) by mouth every 8 (eight) hours as needed  for spasms (Abdominal cramping). (Patient not taking: Reported on 06/21/2022)   [DISCONTINUED] fluocinonide cream (LIDEX) 0.05 % Apply 1 application topically 2 (two) times daily as needed (eczema). (Patient not taking: Reported on 06/21/2022)   [DISCONTINUED] lithium carbonate (LITHOBID) 300 MG CR tablet Take 3 tablets (900 mg total) by mouth at bedtime. (Patient not taking: Reported on 06/21/2022)   [DISCONTINUED] metroNIDAZOLE (FLAGYL) 500 MG tablet Take 1 tablet (500 mg total) by mouth 2 (two) times daily.   [DISCONTINUED] ondansetron (ZOFRAN-ODT) 4 MG disintegrating tablet Take 1 tablet (4 mg total) by mouth every 6 (six) hours as needed for nausea or vomiting. (Patient not taking: Reported on 07/06/2022)   No facility-administered encounter medications on file as of 07/06/2022.    Past Medical History:  Diagnosis Date   ADHD (attention deficit hyperactivity disorder)    Asthma    Seasonal allergies    Vision abnormalities     Past Surgical History:  Procedure Laterality Date   CESAREAN SECTION  02/22/2021    Family History  Problem Relation Age of Onset   Hypertension Mother    Miscarriages / [place] Mother    Asthma Father    Asthma Brother    Heart disease Maternal Aunt    Diabetes Maternal Aunt    Heart disease Maternal Uncle    Diabetes Paternal Aunt    Arthritis Maternal Grandmother    Heart disease Maternal Grandmother    Hypertension Paternal Grandmother  Diabetes Paternal Grandmother    Hypertension Paternal Grandfather     Social History   Socioeconomic History   Marital status: Single    Spouse name: Not on file   Number of children: Not on file   Years of education: Not on file   Highest education level: Not on file  Occupational History   Not on file  Tobacco Use   Smoking status: Every Day    Types: Cigarettes, Cigars   Smokeless tobacco: Never  Vaping Use   Vaping Use: Every day  Substance and Sexual Activity   Alcohol use: No   Drug use:  Yes    Types: Marijuana    Comment: Smoke   Sexual activity: Never  Other Topics Concern   Not on file  Social History Narrative   Not on file   Social Determinants of Health   Financial Resource Strain: Not on file  Food Insecurity: Not on file  Transportation Needs: Not on file  Physical Activity: Not on file  Stress: Not on file  Social Connections: Not on file  Intimate Partner Violence: Not on file    Review of Systems  Constitutional: Negative.   HENT: Negative.    Eyes:  Negative for blurred vision, discharge and redness.  Respiratory: Negative.    Cardiovascular: Negative.   Gastrointestinal:  Negative for abdominal pain.  Genitourinary: Negative.   Musculoskeletal: Negative.  Negative for myalgias.  Skin:  Negative for rash.  Neurological:  Negative for tingling, loss of consciousness and weakness.  Endo/Heme/Allergies:  Negative for polydipsia.         07/06/2022    1:08 PM  Depression screen PHQ 2/9  Decreased Interest 0  Down, Depressed, Hopeless 0  PHQ - 2 Score 0      Objective    BP 112/68   Pulse 82   Temp 98 F (36.7 C) (Temporal)   Ht 5\' 2"  (1.575 m)   Wt 193 lb 9.6 oz (87.8 kg)   LMP 05/24/2022   SpO2 100%   BMI 35.41 kg/m   Physical Exam Constitutional:      General: She is not in acute distress.    Appearance: Normal appearance. She is not ill-appearing, toxic-appearing or diaphoretic.  HENT:     Head: Normocephalic and atraumatic.     Right Ear: External ear normal.     Left Ear: External ear normal.     Mouth/Throat:     Mouth: Mucous membranes are moist.     Pharynx: Oropharynx is clear. No oropharyngeal exudate or posterior oropharyngeal erythema.  Eyes:     General: No scleral icterus.       Right eye: No discharge.        Left eye: No discharge.     Extraocular Movements: Extraocular movements intact.     Conjunctiva/sclera: Conjunctivae normal.     Pupils: Pupils are equal, round, and reactive to light.   Cardiovascular:     Rate and Rhythm: Normal rate and regular rhythm.  Pulmonary:     Effort: Pulmonary effort is normal. No respiratory distress.     Breath sounds: Normal breath sounds.  Abdominal:     General: Bowel sounds are normal.  Musculoskeletal:     Cervical back: No rigidity or tenderness.  Skin:    General: Skin is warm and dry.  Neurological:     Mental Status: She is alert and oriented to person, place, and time.  Psychiatric:        Mood  and Affect: Mood normal.        Behavior: Behavior normal.         Assessment & Plan:   Amenorrhea -     hCG, serum, qualitative  Healthcare maintenance -     CBC -     Comprehensive metabolic panel -     Lipid panel -     Urinalysis, Routine w reflex microscopic  Encounter for hepatitis C screening test for low risk patient -     Hepatitis C antibody  Screening for HIV (human immunodeficiency virus) -     HIV Antibody (routine testing w rflx)  History of bipolar disorder     Return in about 6 months (around 01/06/2023), or if symptoms worsen or fail to improve.  Recommended talking therapy to begin with for her history of bipolar disease.  She will consider it and let me know.  Given information on health maintenance and disease prevention.  She will continue follow-up with her current GYN provider.  Mliss Sax, MD

## 2022-07-07 LAB — COMPREHENSIVE METABOLIC PANEL
ALT: 10 U/L (ref 0–35)
AST: 12 U/L (ref 0–37)
Albumin: 4.1 g/dL (ref 3.5–5.2)
Alkaline Phosphatase: 73 U/L (ref 39–117)
BUN: 8 mg/dL (ref 6–23)
CO2: 28 mEq/L (ref 19–32)
Calcium: 9.6 mg/dL (ref 8.4–10.5)
Chloride: 104 mEq/L (ref 96–112)
Creatinine, Ser: 0.71 mg/dL (ref 0.40–1.20)
GFR: 122.69 mL/min (ref >60.00)
Glucose, Bld: 77 mg/dL (ref 70–99)
Potassium: 3.9 mEq/L (ref 3.5–5.1)
Sodium: 138 mEq/L (ref 135–145)
Total Bilirubin: 0.4 mg/dL (ref 0.2–1.2)
Total Protein: 7.1 g/dL (ref 6.0–8.3)

## 2022-07-07 LAB — CBC
HCT: 38.5 % (ref 36.0–46.0)
Hemoglobin: 12.6 g/dL (ref 12.0–15.0)
MCHC: 32.8 g/dL (ref 30.0–36.0)
MCV: 89.5 fl (ref 78.0–100.0)
Platelets: 291 10*3/uL (ref 150.0–400.0)
RBC: 4.3 Mil/uL (ref 3.87–5.11)
RDW: 13.2 % (ref 11.5–14.6)
WBC: 8.8 10*3/uL (ref 4.5–10.5)

## 2022-07-07 LAB — HCG, SERUM, QUALITATIVE: Preg, Serum: NEGATIVE

## 2022-07-07 LAB — LIPID PANEL
Cholesterol: 133 mg/dL (ref 0–200)
HDL: 44.1 mg/dL (ref >39.00)
LDL Cholesterol: 78 mg/dL (ref 0–99)
NonHDL: 88.48
Total CHOL/HDL Ratio: 3
Triglycerides: 51 mg/dL (ref 0.0–149.0)
VLDL: 10.2 mg/dL (ref 0.0–40.0)

## 2022-07-07 LAB — HEPATITIS C ANTIBODY: Hepatitis C Ab: NONREACTIVE

## 2022-07-07 LAB — HIV ANTIBODY (ROUTINE TESTING W REFLEX): HIV 1&2 Ab, 4th Generation: NONREACTIVE

## 2022-07-24 DIAGNOSIS — Z419 Encounter for procedure for purposes other than remedying health state, unspecified: Secondary | ICD-10-CM | POA: Diagnosis not present

## 2022-08-15 ENCOUNTER — Ambulatory Visit
Admission: EM | Admit: 2022-08-15 | Discharge: 2022-08-15 | Disposition: A | Discharge status: Home / Self Care | Payer: BC Managed Care – PPO

## 2022-08-15 DIAGNOSIS — A084 Viral intestinal infection, unspecified: Secondary | ICD-10-CM | POA: Diagnosis not present

## 2022-08-15 NOTE — Discharge Instructions (Signed)
Recommend you increase the amount of fluids and drink at every opportunity to reduce your risk of dehydration due to several days of vomiting and diarrhea.  Do not use antidiarrhea medication such as Imodium until tomorrow at the earliest.  Follow up here or with your primary care provider if your symptoms are worsening or not improving.

## 2022-08-15 NOTE — ED Provider Notes (Signed)
Amy Mcclure    CSN: [id number] Arrival date & time: 08/15/22  1204      History   Chief Complaint No chief complaint on file.   HPI Amy Mcclure is a 22 y.o. female.   HPI  Presents to urgent care with complaint of symptoms x 2 days.  She endorses vomiting, stomach pain, loose stools.  PMH significant for mild persistent asthma, suicide attempt with ingestion of unknown drug (2016).  She endorses recent contact with other individuals who are similarly sick with "stomach bugs".  Nausea now resolved and last episode of vomiting this morning early.  She endorses tolerance for liquids though does report some dark-colored urine.  Patient has IUD in place and denies chance of pregnancy.  Past Medical History:  Diagnosis Date   ADHD (attention deficit hyperactivity disorder)    Asthma    Seasonal allergies    Vision abnormalities     Patient Active Problem List   Diagnosis Date Noted   Amenorrhea 07/06/2022   Encounter for hepatitis C screening test for low risk patient 07/06/2022   Screening for HIV (human immunodeficiency virus) 07/06/2022   History of bipolar disorder 07/06/2022   Tachycardia 12/22/2020   Hx of suicide attempt 09/02/2020   Morbid obesity with BMI of 40.0-44.9, adult (HCC) 09/02/2020   Trichomoniasis 09/01/2020   Food allergy 08/09/2016   Mild persistent asthma without complication 08/09/2016   Chronic motor tic disorder 06/19/2014   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      Home Medications    Prior to Admission medications   Medication Sig Start Date End Date Taking? Authorizing Provider  albuterol (PROVENTIL HFA;VENTOLIN HFA) 108 (90 BASE) MCG/ACT inhaler Inhale 2 puffs into the lungs every 6 (six) hours as needed (seasonal allergies). 06/24/14   Chauncey Mann, MD  beclomethasone (QVAR) 80 MCG/ACT inhaler Inhale 1 puff into the lungs 2 (two) times daily. 06/24/14   Chauncey Mann, MD  cetirizine (ZYRTEC) 10 MG tablet Take by mouth.    [provider]  FLOVENT HFA 44 MCG/ACT inhaler Inhale into the lungs. 08/21/21   [provider]  polyethylene glycol (MIRALAX / GLYCOLAX) 17 g packet Take by mouth. 02/25/21   [provider]    Family History Family History  Problem Relation Age of Onset   Hypertension Mother    Miscarriages / [place] Mother    Asthma Father    Asthma Brother    Heart disease Maternal Aunt    Diabetes Maternal Aunt    Heart disease Maternal Uncle    Diabetes Paternal Aunt    Arthritis Maternal Grandmother    Heart disease Maternal Grandmother    Hypertension Paternal Grandmother    Diabetes Paternal Grandmother    Hypertension Paternal Grandfather     Social History Social History   Tobacco Use   Smoking status: Every Day    Types: Cigarettes, Cigars   Smokeless tobacco: Never  Vaping Use   Vaping  Use: Every day  Substance Use Topics   Alcohol use: No   Drug use: Yes    Types: Marijuana    Comment: Smoke     Allergies   Egg-derived products, Shellfish allergy, Cashew nut oil, Manganese, Milk-related compounds, Other, Penicillins, Barley grass, and Red dye   Review of Systems Review of Systems   Physical Exam Triage Vital Signs ED Triage Vitals  Enc Vitals Group     BP      Pulse      Resp      Temp      Temp src      SpO2      Weight      Height      Head Circumference      Peak Flow      Pain Score       Pain Loc      Pain Edu?      Excl. in GC?    No data found.  Updated Vital Signs There were no vitals taken for this visit.  Visual Acuity Right Eye Distance:   Left Eye Distance:   Bilateral Distance:    Right Eye Near:   Left Eye Near:    Bilateral Near:     Physical Exam Vitals reviewed.  Constitutional:      Appearance: Normal appearance.  Cardiovascular:     Rate and Rhythm: Normal rate and regular rhythm.     Pulses: Normal pulses.     Heart sounds: Normal heart sounds.  Pulmonary:     Effort: Pulmonary effort is normal.     Breath sounds: Normal breath sounds.  Abdominal:     General: Bowel sounds are increased.     Palpations: Abdomen is soft.     Tenderness: There is abdominal tenderness in the right lower quadrant. Negative signs include McBurney's sign.  Skin:    General: Skin is warm and dry.  Neurological:     General: No focal deficit present.     Mental Status: She is alert and oriented to person, place, and time.  Psychiatric:        Mood and Affect: Mood normal.        Behavior: Behavior normal.      UC Treatments / Results  Labs (all labs ordered are listed, but only abnormal results are displayed) Labs Reviewed - No data to display  EKG   Radiology No results found.  Procedures Procedures (including critical care time)  Medications Ordered in UC Medications - No data to display  Initial Impression / Assessment and Plan / UC Course  I have reviewed the triage vital signs and the nursing notes.  Pertinent labs & imaging results that were available during my care of the patient were reviewed by me and considered in my medical decision making (see chart for details).   Amy Mcclure is a 22 y.o. female presenting with n/v/d. Patient is afebrile without recent antipyretics, satting well on room air. Overall is well appearing, without respiratory distress. Pulmonary exam is unremarkable.  Lungs CTAB without wheezing, rhonchi, rales.  RRR without murmurs, rubs, gallops.  Abdomen is soft with mild RLQ tenderness.  Bowel sounds are hyperactive.  Reviewed relevant chart history.   Patient has IUD in place and pregnancy is very low on differential.  Patient's symptoms are consistent with an acute viral process versus foodborne illness.  Possibly some mild dehydration given her report of dark-colored urine.  Recommending pushing hydration and continuing with other  supportive care measures.  Recommended against using antidiarrheal agents until tomorrow at the earliest.  Counseled patient on potential for adverse effects with medications prescribed/recommended today, ER and return-to-clinic precautions discussed, patient verbalized understanding and agreement with care plan.  Final Clinical Impressions(s) / UC Diagnoses   Final diagnoses:  None   Discharge Instructions   None    ED Prescriptions   None    PDMP not reviewed this encounter.   Charma Igo, [place] 08/15/22 1240

## 2022-08-15 NOTE — ED Triage Notes (Signed)
Patient presents to UC for vomiting, diarrhea, and stomach pain x 2 days. Not taking any OTC meds.

## 2022-08-23 DIAGNOSIS — Z419 Encounter for procedure for purposes other than remedying health state, unspecified: Secondary | ICD-10-CM | POA: Diagnosis not present

## 2022-09-23 DIAGNOSIS — Z419 Encounter for procedure for purposes other than remedying health state, unspecified: Secondary | ICD-10-CM | POA: Diagnosis not present

## 2022-10-24 DIAGNOSIS — Z419 Encounter for procedure for purposes other than remedying health state, unspecified: Secondary | ICD-10-CM | POA: Diagnosis not present

## 2022-10-29 IMAGING — CT CT RENAL STONE PROTOCOL
2 of 4 series · 16 of 46 positions shown, 18 images · non-contrast
Comparison: None.

CLINICAL DATA: Flank pain.  Concern for kidney stone.



[Series 2: stone full standard · axial · 0.81mm/px · z∈[-864,-464]mm · 13 of 90 slices shown, 15 images]
[im 5/90  soft-tissue]
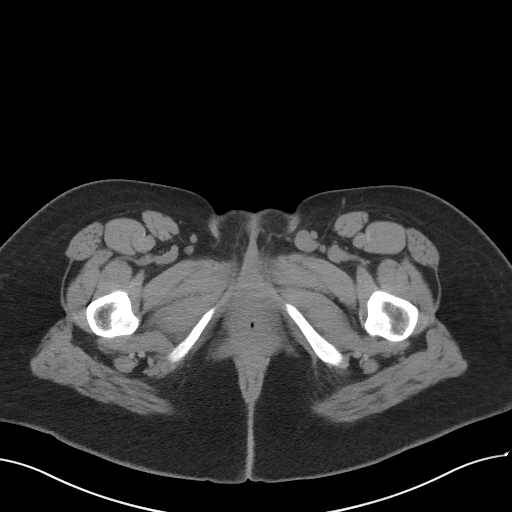
[im 5/90  bone]
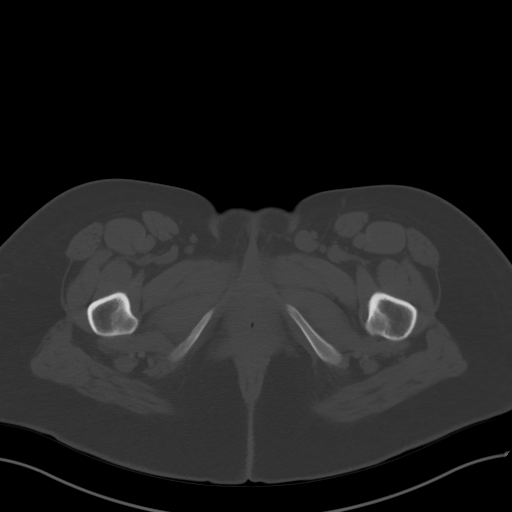
[im 13/90  soft-tissue]
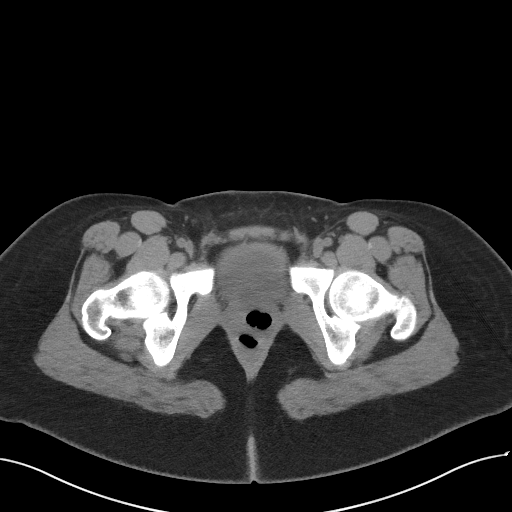
[im 17/90  soft-tissue]
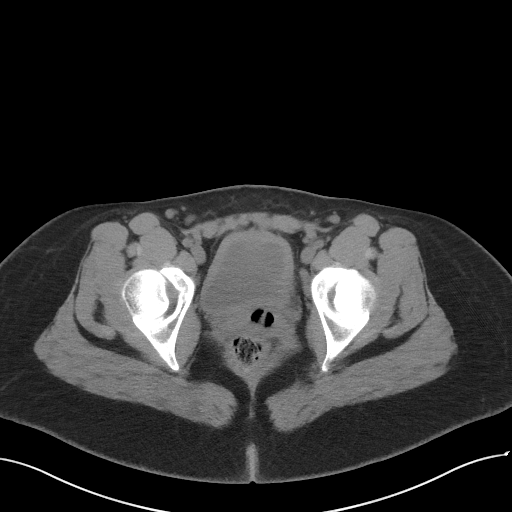
[im 26/90  soft-tissue]
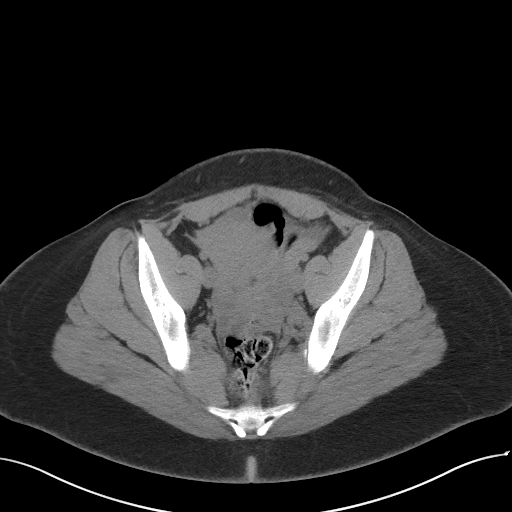
[im 30/90  soft-tissue]
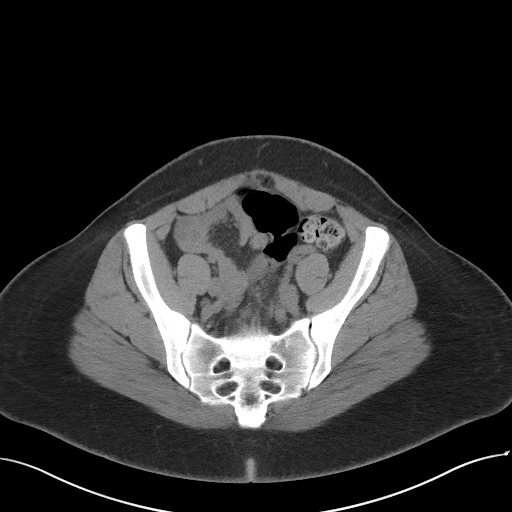
[im 39/90  soft-tissue]
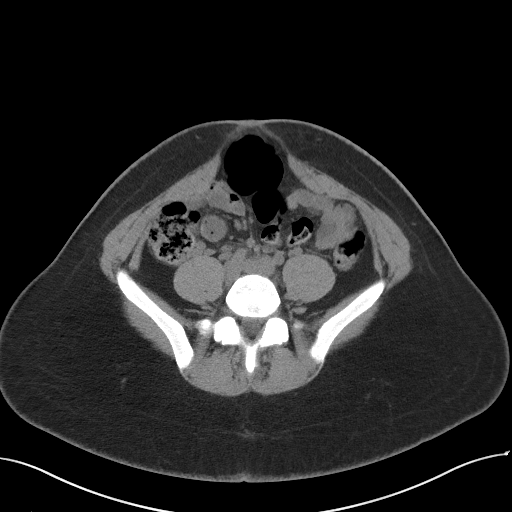
[im 47/90  soft-tissue]
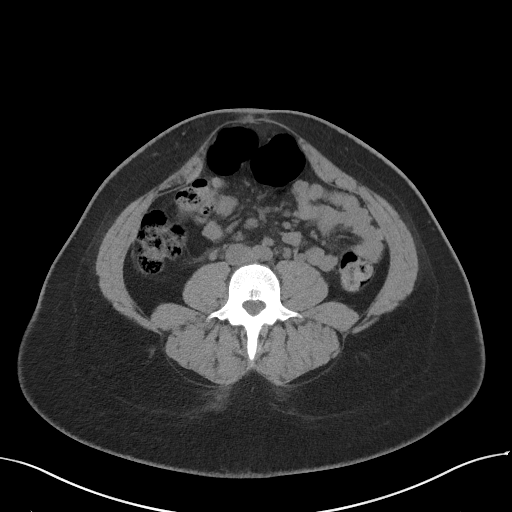
[im 51/90  soft-tissue]
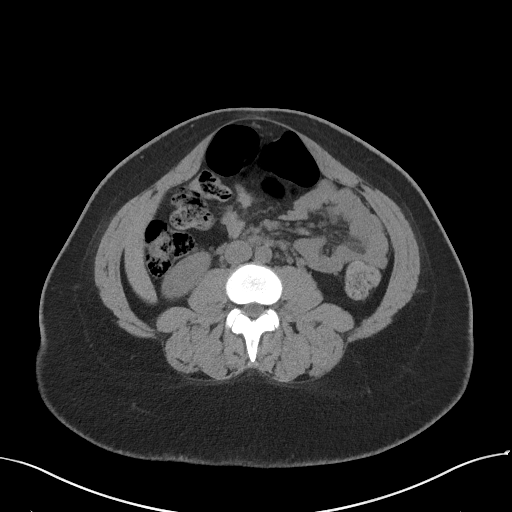
[im 60/90  soft-tissue]
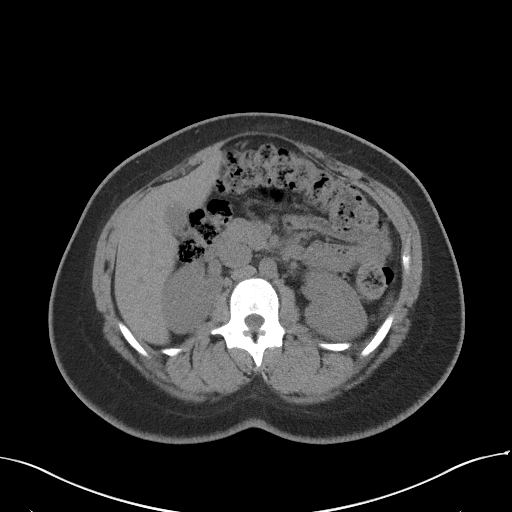
[im 60/90  bone]
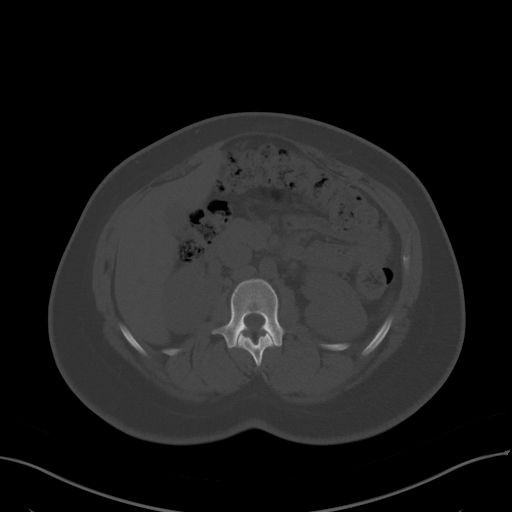
[im 64/90  soft-tissue]
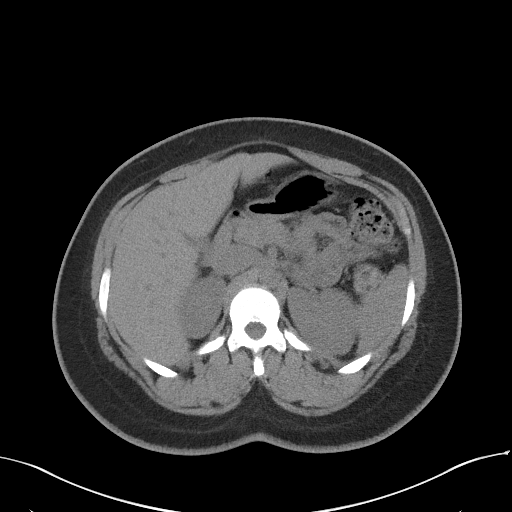
[im 73/90  soft-tissue]
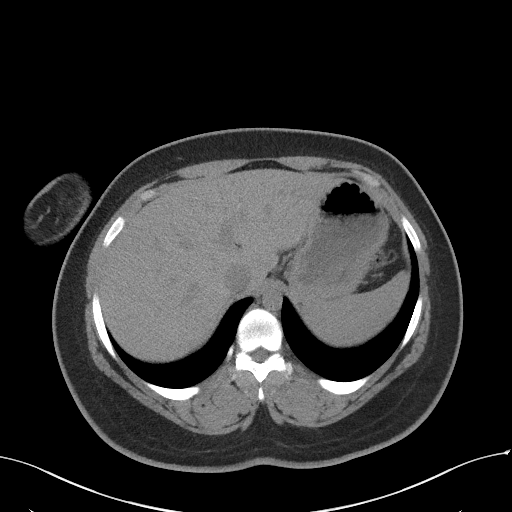
[im 77/90  soft-tissue]
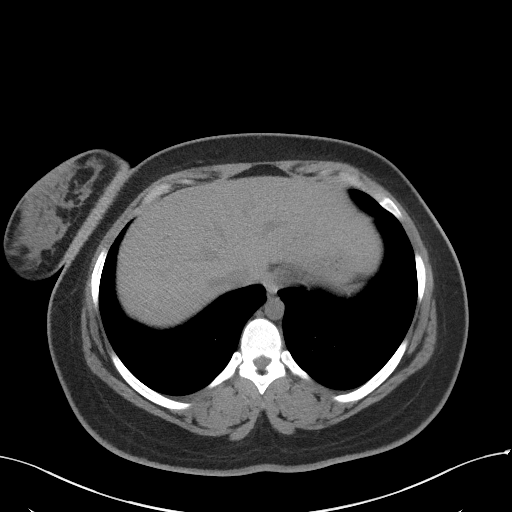
[im 85/90  soft-tissue]
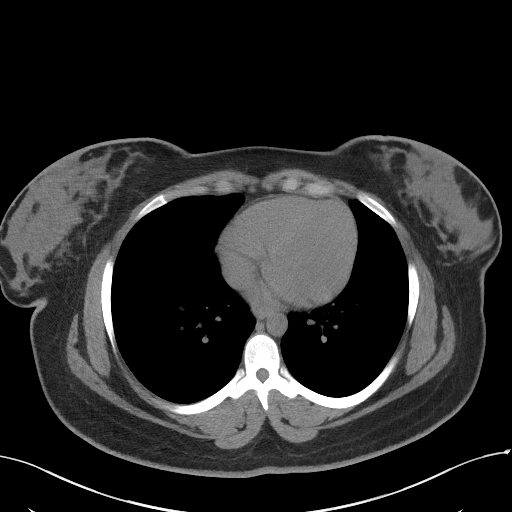

[Series 5: coronal · coronal · 0.80mm/px · 3 of 151 slices shown]
[im 51/151  soft-tissue]
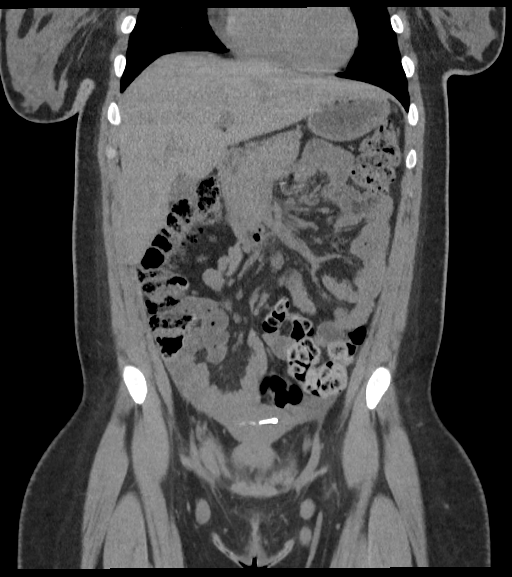
[im 67/151  soft-tissue]
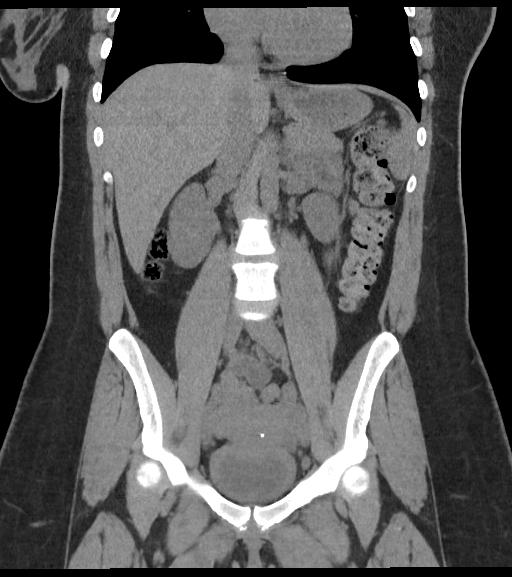
[im 84/151  soft-tissue]
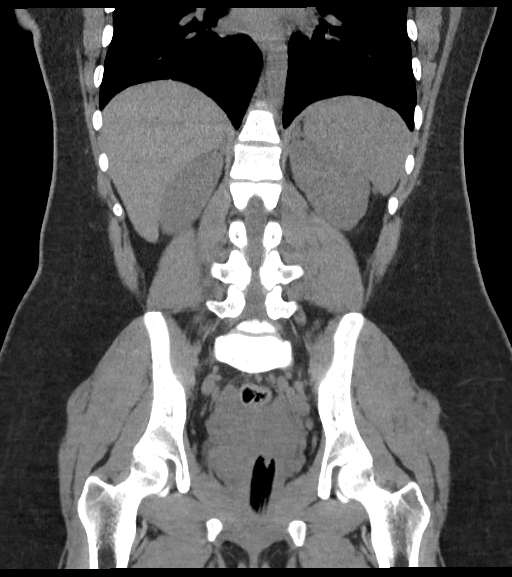

[16 of 46 positions shown; findings below may reference images not displayed]

FINDINGS: Evaluation of this exam is limited in the absence of intravenous
contrast.

Lower chest: The visualized lung bases are clear.

No intra-abdominal free air or free fluid.

Hepatobiliary: No focal liver abnormality is seen. No gallstones,
gallbladder wall thickening, or biliary dilatation.

Pancreas: Unremarkable. No pancreatic ductal dilatation or
surrounding inflammatory changes.

Spleen: Normal in size without focal abnormality.

Adrenals/Urinary Tract: Adrenal glands are unremarkable. Kidneys are
normal, without renal calculi, focal lesion, or hydronephrosis.
Bladder is unremarkable.

Stomach/Bowel: Moderate stool throughout the colon. There is no
bowel obstruction or active inflammation. The appendix is normal.

Vascular/Lymphatic: The abdominal aorta and IVC are grossly
unremarkable on this noncontrast CT. No portal venous gas. There is
no adenopathy.

Reproductive: The uterus is anteverted. An intrauterine device is
noted. No adnexal masses. A tampon is noted in the vagina.

Other: There is diastasis of anterior abdominal wall musculature in
the midline.

Musculoskeletal: No acute or significant osseous findings.
IMPRESSION: 1. No acute intra-abdominal or pelvic pathology. No hydronephrosis
or nephrolithiasis.
2. Moderate colonic stool burden. No bowel obstruction. Normal
appendix.

## 2022-10-29 IMAGING — CT CT HEAD W/O CM
4 series · 16 of 47 positions shown, 18 images · non-contrast
Comparison: 11/17/2011

CLINICAL DATA: Headache, tonsillar swelling



[Series 2: head wo · axial · 0.45mm/px · z∈[-178,-63]mm · 7 of 31 slices shown, 9 images]
[im 4/31  brain]
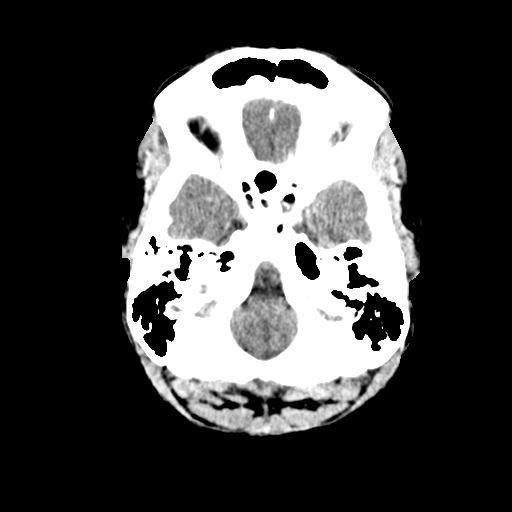
[im 4/31  bone]
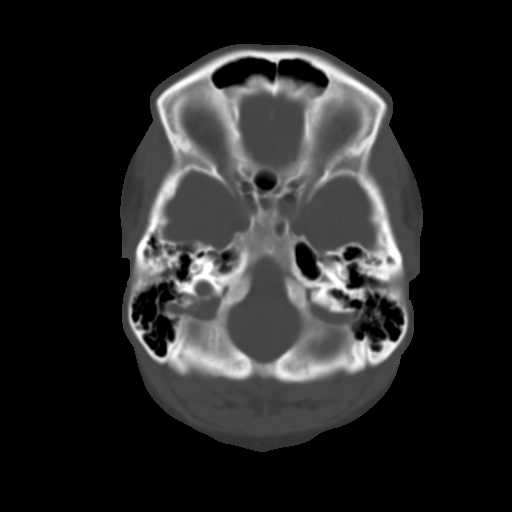
[im 8/31  brain]
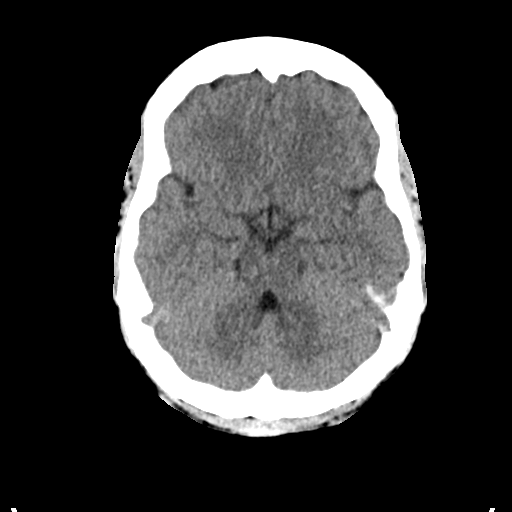
[im 12/31  brain]
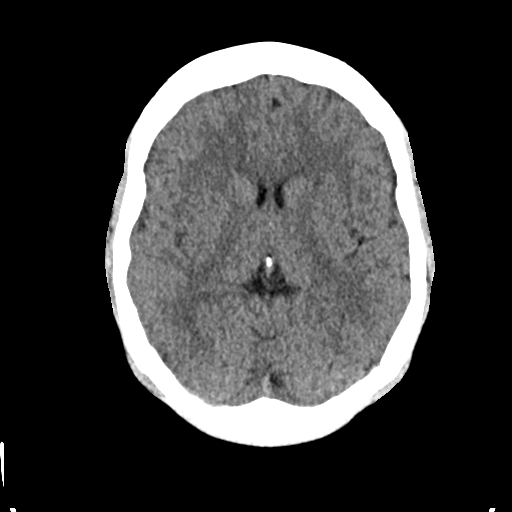
[im 16/31  brain]
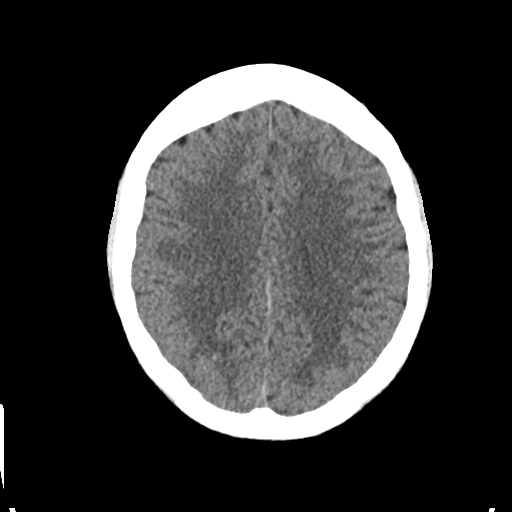
[im 19/31  brain]
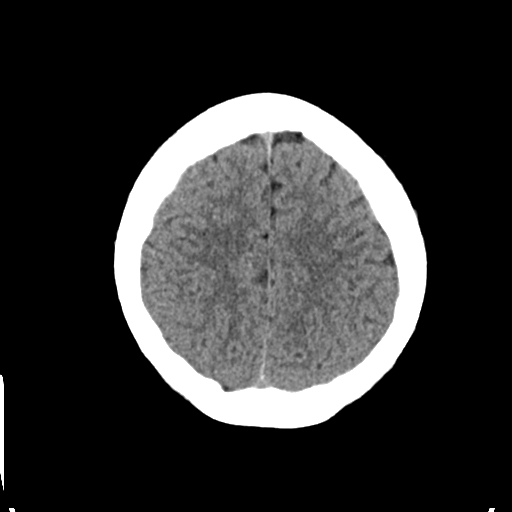
[im 19/31  bone]
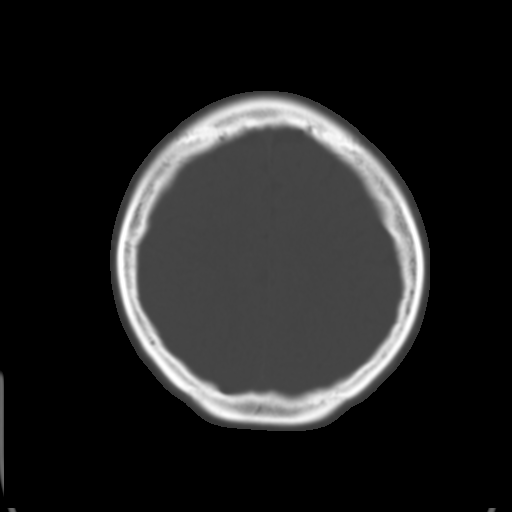
[im 23/31  brain]
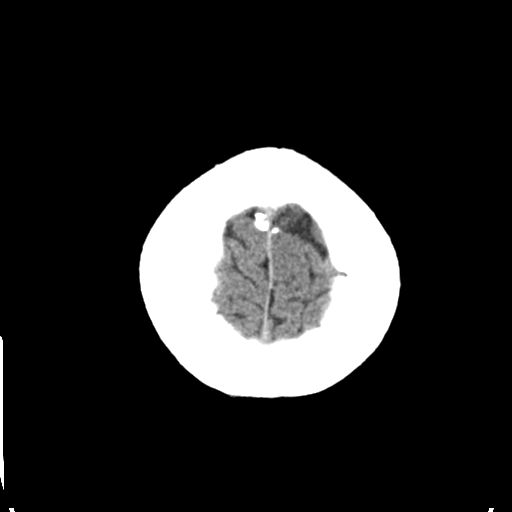
[im 27/31  brain]
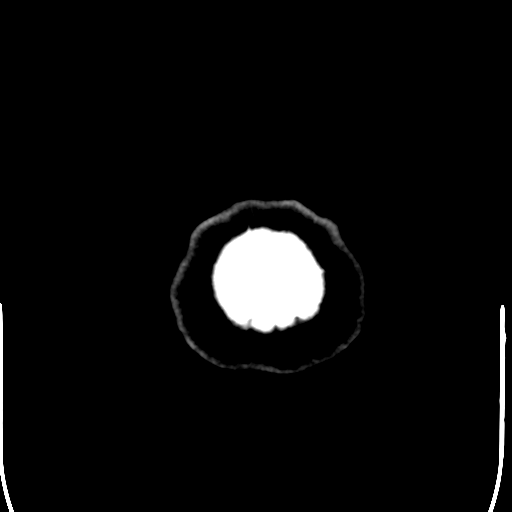

[Series 3: head bone · axial · 0.45mm/px · z∈[-179,-149]mm · 3 of 76 slices shown]
[im 8/76  bone]
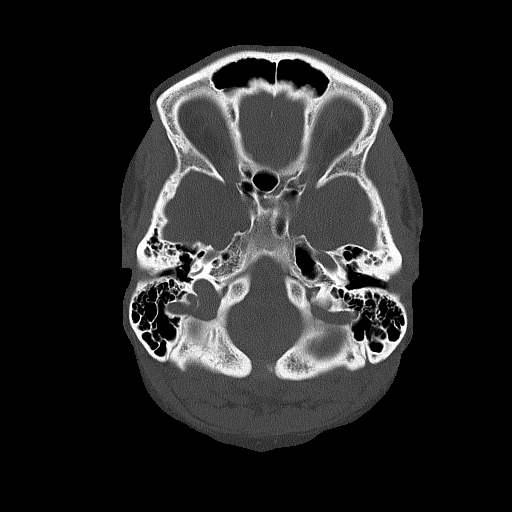
[im 16/76  bone]
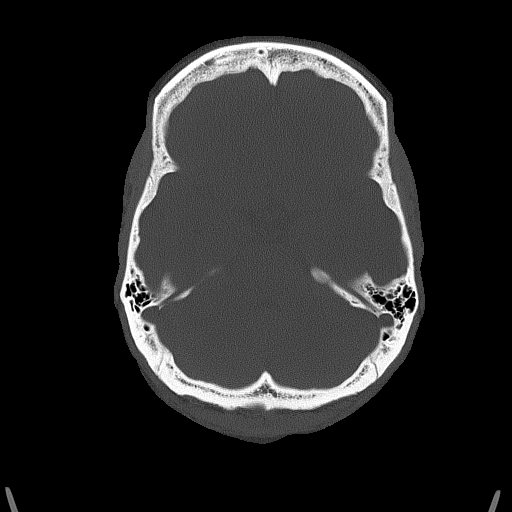
[im 23/76  bone]
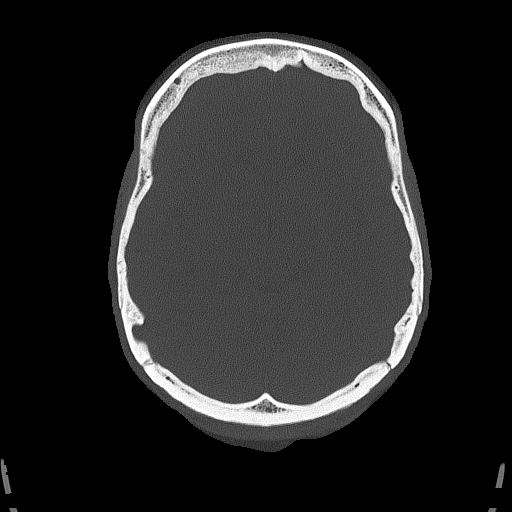

[Series 4: coronal soft tissue · coronal · 0.32mm/px · 3 of 65 slices shown]
[im 22/65  brain]
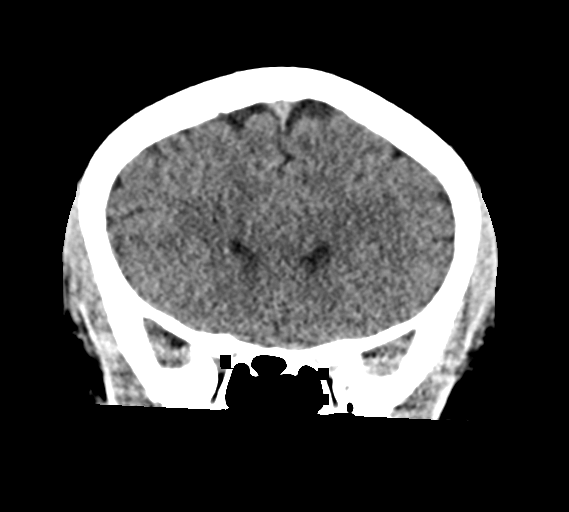
[im 29/65  brain]
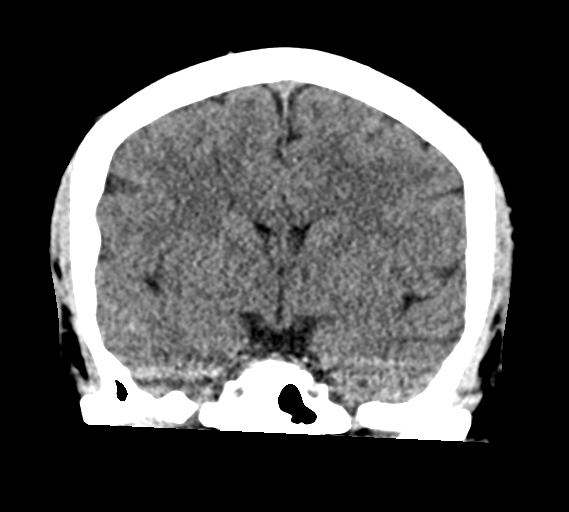
[im 36/65  brain]
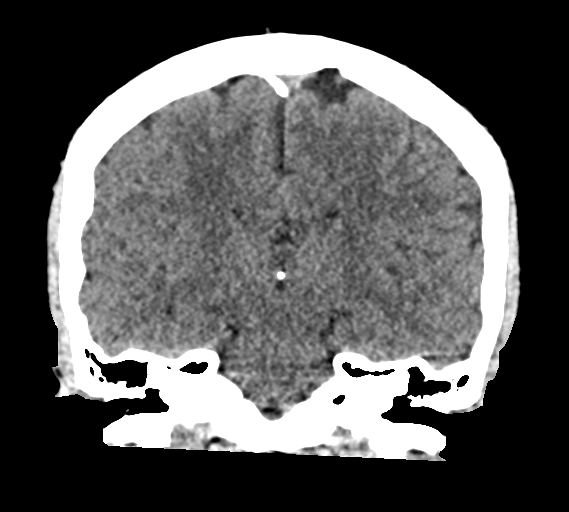

[Series 5: sagittal soft tissue · sagittal · 0.32mm/px · 3 of 56 slices shown]
[im 19/56  brain]
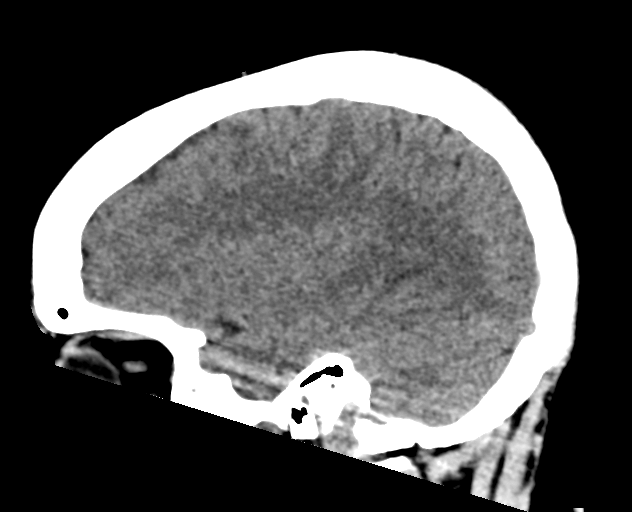
[im 28/56  brain]
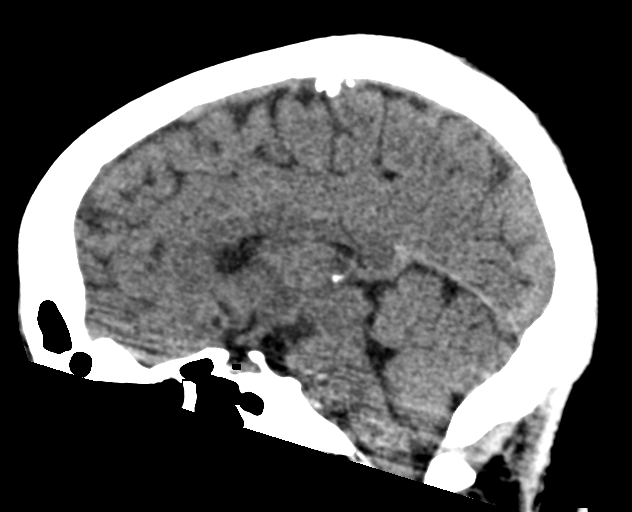
[im 37/56  brain]
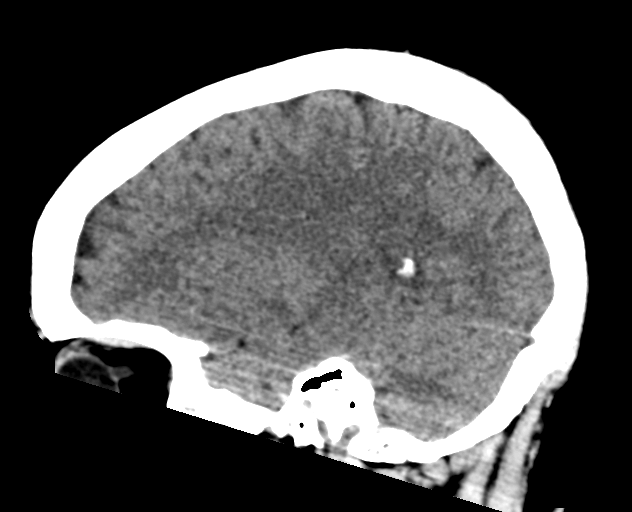

[16 of 47 positions shown; findings below may reference images not displayed]

FINDINGS: Brain: No acute infarct or hemorrhage. Lateral ventricles and
midline structures are unremarkable. No acute extra-axial fluid
collections. No mass effect.

Vascular: No hyperdense vessel or unexpected calcification.

Skull: Normal. Negative for fracture or focal lesion.

Sinuses/Orbits: No acute finding.

Other: None.
IMPRESSION: 1. No acute intracranial process.

## 2022-11-23 DIAGNOSIS — Z419 Encounter for procedure for purposes other than remedying health state, unspecified: Secondary | ICD-10-CM | POA: Diagnosis not present

## 2022-12-24 DIAGNOSIS — Z419 Encounter for procedure for purposes other than remedying health state, unspecified: Secondary | ICD-10-CM | POA: Diagnosis not present

## 2022-12-31 ENCOUNTER — Other Ambulatory Visit: Payer: Self-pay

## 2022-12-31 ENCOUNTER — Encounter: Payer: Self-pay | Admitting: Emergency Medicine

## 2022-12-31 ENCOUNTER — Ambulatory Visit
Admission: EM | Admit: 2022-12-31 | Discharge: 2022-12-31 | Disposition: A | Discharge status: Home / Self Care | Payer: BC Managed Care – PPO | Attending: Emergency Medicine | Admitting: Emergency Medicine

## 2022-12-31 DIAGNOSIS — Z113 Encounter for screening for infections with a predominantly sexual mode of transmission: Secondary | ICD-10-CM | POA: Diagnosis not present

## 2022-12-31 DIAGNOSIS — N760 Acute vaginitis: Secondary | ICD-10-CM | POA: Insufficient documentation

## 2022-12-31 DIAGNOSIS — B9689 Other specified bacterial agents as the cause of diseases classified elsewhere: Secondary | ICD-10-CM | POA: Diagnosis not present

## 2022-12-31 DIAGNOSIS — B3731 Acute candidiasis of vulva and vagina: Secondary | ICD-10-CM | POA: Insufficient documentation

## 2022-12-31 DIAGNOSIS — K130 Diseases of lips: Secondary | ICD-10-CM | POA: Diagnosis not present

## 2022-12-31 MED ORDER — HYDROCORTISONE 1 % EX CREA
TOPICAL_CREAM | CUTANEOUS | 0 refills | Status: AC
Start: 2022-12-31 — End: ?
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
## 2022-12-31 NOTE — Discharge Instructions (Addendum)
Low suspicion that you have herpes as symptoms do not start immediately with contact, typical presentation of herpes will cause blistering over the affected area that will cause pain and itching  If symptoms started after immediate contact it is most likely an allergic reaction, you may apply hydrocortisone cream to your lips twice daily to help with the burning sensation  Labs pending 2-3 days, you will be contacted if positive for any sti and treatment will be sent to the pharmacy, you will have to return to the clinic if positive for gonorrhea to receive treatment   Please refrain from having sex until labs results, if positive please refrain from having sex until treatment complete and symptoms resolve   If positive for HIV, Syphilis, Chlamydia  gonorrhea or trichomoniasis please notify partner or partners so they may tested as well  Moving forward, it is recommended you use some form of protection against the transmission of sti infections  such as condoms or dental dams with each sexual encounter    For thoughts of wanting to die please schedule a follow-up appointment with the behavioral health clinic, information is listed on front page, if you do not want to schedule an appointment they have walk-in hours Monday through Friday on the second floor  At any point if you feel that symptoms are worsening please call 911 go to the nearest emergency department for immediate assistance

## 2022-12-31 NOTE — ED Triage Notes (Addendum)
Pt states" I do not have thoughts about harming myself but do want to die".  Denies active suicidal plan or wanting to hurt self. Provider notified of patients comments.
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
## 2023-01-01 LAB — HIV ANTIBODY (ROUTINE TESTING W REFLEX): HIV Screen 4th Generation wRfx: NONREACTIVE

## 2023-01-01 LAB — RPR: RPR Ser Ql: NONREACTIVE

## 2023-01-03 ENCOUNTER — Telehealth (HOSPITAL_BASED_OUTPATIENT_CLINIC_OR_DEPARTMENT_OTHER): Payer: Self-pay

## 2023-01-03 LAB — CERVICOVAGINAL ANCILLARY ONLY
Bacterial Vaginitis (gardnerella): POSITIVE — AB
Candida Glabrata: NEGATIVE
Candida Vaginitis: NEGATIVE
Chlamydia: NEGATIVE
Comment: NEGATIVE
Comment: NEGATIVE
Comment: NEGATIVE
Comment: NEGATIVE
Comment: NEGATIVE
Comment: NORMAL
Neisseria Gonorrhea: NEGATIVE
Trichomonas: NEGATIVE

## 2023-01-03 MED ORDER — FLUCONAZOLE 150 MG PO TABS: 150.0000 mg | ORAL_TABLET | Freq: Once | ORAL | 0 refills | Status: AC

## 2023-01-03 NOTE — Telephone Encounter (Signed)
Per protocol, pt requires tx with Diflucan.  Rx sent to pharmacy on file.

## 2023-01-06 ENCOUNTER — Ambulatory Visit: Payer: Medicaid Other | Admitting: Family Medicine

## 2023-01-23 DIAGNOSIS — Z419 Encounter for procedure for purposes other than remedying health state, unspecified: Secondary | ICD-10-CM | POA: Diagnosis not present

## 2023-02-04 DIAGNOSIS — Z9151 Personal history of suicidal behavior: Secondary | ICD-10-CM | POA: Diagnosis not present

## 2023-02-04 DIAGNOSIS — T39311A Poisoning by propionic acid derivatives, accidental (unintentional), initial encounter: Secondary | ICD-10-CM | POA: Diagnosis not present

## 2023-02-04 DIAGNOSIS — F319 Bipolar disorder, unspecified: Secondary | ICD-10-CM | POA: Diagnosis not present

## 2023-02-04 DIAGNOSIS — T485X4A Poisoning by other anti-common-cold drugs, undetermined, initial encounter: Secondary | ICD-10-CM | POA: Diagnosis not present

## 2023-02-04 DIAGNOSIS — F909 Attention-deficit hyperactivity disorder, unspecified type: Secondary | ICD-10-CM | POA: Diagnosis not present

## 2023-02-04 DIAGNOSIS — F32A Depression, unspecified: Secondary | ICD-10-CM | POA: Diagnosis not present

## 2023-02-04 DIAGNOSIS — Z1152 Encounter for screening for COVID-19: Secondary | ICD-10-CM | POA: Diagnosis not present

## 2023-02-04 DIAGNOSIS — T484X1A Poisoning by expectorants, accidental (unintentional), initial encounter: Secondary | ICD-10-CM | POA: Diagnosis not present

## 2023-02-04 DIAGNOSIS — F17210 Nicotine dependence, cigarettes, uncomplicated: Secondary | ICD-10-CM | POA: Diagnosis not present

## 2023-02-05 DIAGNOSIS — T485X4A Poisoning by other anti-common-cold drugs, undetermined, initial encounter: Secondary | ICD-10-CM | POA: Diagnosis not present

## 2023-02-05 DIAGNOSIS — F32A Depression, unspecified: Secondary | ICD-10-CM | POA: Diagnosis not present

## 2023-02-05 DIAGNOSIS — Z9151 Personal history of suicidal behavior: Secondary | ICD-10-CM | POA: Diagnosis not present

## 2023-02-06 DIAGNOSIS — F603 Borderline personality disorder: Secondary | ICD-10-CM | POA: Diagnosis not present

## 2023-02-06 DIAGNOSIS — J4530 Mild persistent asthma, uncomplicated: Secondary | ICD-10-CM | POA: Diagnosis not present

## 2023-02-06 DIAGNOSIS — F17290 Nicotine dependence, other tobacco product, uncomplicated: Secondary | ICD-10-CM | POA: Diagnosis not present

## 2023-02-06 DIAGNOSIS — F12159 Cannabis abuse with psychotic disorder, unspecified: Secondary | ICD-10-CM | POA: Diagnosis not present

## 2023-02-06 DIAGNOSIS — Z888 Allergy status to other drugs, medicaments and biological substances status: Secondary | ICD-10-CM | POA: Diagnosis not present

## 2023-02-06 DIAGNOSIS — F29 Unspecified psychosis not due to a substance or known physiological condition: Secondary | ICD-10-CM | POA: Diagnosis not present

## 2023-02-06 DIAGNOSIS — Z9151 Personal history of suicidal behavior: Secondary | ICD-10-CM | POA: Diagnosis not present

## 2023-02-06 DIAGNOSIS — R319 Hematuria, unspecified: Secondary | ICD-10-CM | POA: Diagnosis not present

## 2023-02-06 DIAGNOSIS — Z91048 Other nonmedicinal substance allergy status: Secondary | ICD-10-CM | POA: Diagnosis not present

## 2023-02-06 DIAGNOSIS — Z91018 Allergy to other foods: Secondary | ICD-10-CM | POA: Diagnosis not present

## 2023-02-06 DIAGNOSIS — N898 Other specified noninflammatory disorders of vagina: Secondary | ICD-10-CM | POA: Diagnosis not present

## 2023-02-06 DIAGNOSIS — F3132 Bipolar disorder, current episode depressed, moderate: Secondary | ICD-10-CM | POA: Diagnosis not present

## 2023-02-06 DIAGNOSIS — Z72820 Sleep deprivation: Secondary | ICD-10-CM | POA: Diagnosis not present

## 2023-02-06 DIAGNOSIS — Z91012 Allergy to eggs: Secondary | ICD-10-CM | POA: Diagnosis not present

## 2023-02-06 DIAGNOSIS — Z880 Allergy status to penicillin: Secondary | ICD-10-CM | POA: Diagnosis not present

## 2023-02-06 DIAGNOSIS — Z91013 Allergy to seafood: Secondary | ICD-10-CM | POA: Diagnosis not present

## 2023-02-06 DIAGNOSIS — F314 Bipolar disorder, current episode depressed, severe, without psychotic features: Secondary | ICD-10-CM | POA: Diagnosis not present

## 2023-02-06 DIAGNOSIS — F4310 Post-traumatic stress disorder, unspecified: Secondary | ICD-10-CM | POA: Diagnosis not present

## 2023-03-02 DIAGNOSIS — F339 Major depressive disorder, recurrent, unspecified: Secondary | ICD-10-CM | POA: Diagnosis not present

## 2023-04-05 DIAGNOSIS — F319 Bipolar disorder, unspecified: Secondary | ICD-10-CM | POA: Diagnosis not present

## 2023-11-23 DIAGNOSIS — J029 Acute pharyngitis, unspecified: Secondary | ICD-10-CM | POA: Diagnosis not present

## 2023-11-23 DIAGNOSIS — J069 Acute upper respiratory infection, unspecified: Secondary | ICD-10-CM | POA: Diagnosis not present

## 2023-11-23 DIAGNOSIS — R051 Acute cough: Secondary | ICD-10-CM | POA: Diagnosis not present

## 2023-12-29 DIAGNOSIS — S8251XA Displaced fracture of medial malleolus of right tibia, initial encounter for closed fracture: Secondary | ICD-10-CM | POA: Diagnosis not present

## 2023-12-29 DIAGNOSIS — S59912A Unspecified injury of left forearm, initial encounter: Secondary | ICD-10-CM | POA: Diagnosis not present

## 2023-12-29 DIAGNOSIS — Z23 Encounter for immunization: Secondary | ICD-10-CM | POA: Diagnosis not present

## 2023-12-29 DIAGNOSIS — M25532 Pain in left wrist: Secondary | ICD-10-CM | POA: Diagnosis not present

## 2023-12-29 DIAGNOSIS — M25531 Pain in right wrist: Secondary | ICD-10-CM | POA: Diagnosis not present

## 2023-12-29 DIAGNOSIS — S82891A Other fracture of right lower leg, initial encounter for closed fracture: Secondary | ICD-10-CM | POA: Diagnosis not present

## 2023-12-29 DIAGNOSIS — Z5181 Encounter for therapeutic drug level monitoring: Secondary | ICD-10-CM | POA: Diagnosis not present

## 2023-12-29 DIAGNOSIS — Z6740 Type O blood, Rh positive: Secondary | ICD-10-CM | POA: Diagnosis not present

## 2023-12-29 DIAGNOSIS — M25774 Osteophyte, right foot: Secondary | ICD-10-CM | POA: Diagnosis not present

## 2023-12-29 DIAGNOSIS — S6992XA Unspecified injury of left wrist, hand and finger(s), initial encounter: Secondary | ICD-10-CM | POA: Diagnosis not present

## 2023-12-29 DIAGNOSIS — J45909 Unspecified asthma, uncomplicated: Secondary | ICD-10-CM | POA: Diagnosis not present

## 2023-12-29 DIAGNOSIS — Z79899 Other long term (current) drug therapy: Secondary | ICD-10-CM | POA: Diagnosis not present

## 2023-12-29 DIAGNOSIS — M25571 Pain in right ankle and joints of right foot: Secondary | ICD-10-CM | POA: Diagnosis not present

## 2023-12-29 DIAGNOSIS — F17290 Nicotine dependence, other tobacco product, uncomplicated: Secondary | ICD-10-CM | POA: Diagnosis not present

## 2023-12-29 DIAGNOSIS — M25561 Pain in right knee: Secondary | ICD-10-CM | POA: Diagnosis not present

## 2023-12-29 DIAGNOSIS — F17210 Nicotine dependence, cigarettes, uncomplicated: Secondary | ICD-10-CM | POA: Diagnosis not present

## 2023-12-29 DIAGNOSIS — Z3202 Encounter for pregnancy test, result negative: Secondary | ICD-10-CM | POA: Diagnosis not present

## 2023-12-29 DIAGNOSIS — S6991XA Unspecified injury of right wrist, hand and finger(s), initial encounter: Secondary | ICD-10-CM | POA: Diagnosis not present

## 2024-01-03 DIAGNOSIS — S8254XD Nondisplaced fracture of medial malleolus of right tibia, subsequent encounter for closed fracture with routine healing: Secondary | ICD-10-CM | POA: Diagnosis not present

## 2024-01-05 DIAGNOSIS — S8254XD Nondisplaced fracture of medial malleolus of right tibia, subsequent encounter for closed fracture with routine healing: Secondary | ICD-10-CM | POA: Diagnosis not present

## 2024-01-11 DIAGNOSIS — M93271 Osteochondritis dissecans, right ankle and joints of right foot: Secondary | ICD-10-CM | POA: Diagnosis not present

## 2024-01-11 DIAGNOSIS — S8251XA Displaced fracture of medial malleolus of right tibia, initial encounter for closed fracture: Secondary | ICD-10-CM | POA: Diagnosis not present

## 2024-01-11 DIAGNOSIS — G8918 Other acute postprocedural pain: Secondary | ICD-10-CM | POA: Diagnosis not present

## 2024-01-11 DIAGNOSIS — M24071 Loose body in right ankle: Secondary | ICD-10-CM | POA: Diagnosis not present

## 2024-01-26 DIAGNOSIS — S8254XA Nondisplaced fracture of medial malleolus of right tibia, initial encounter for closed fracture: Secondary | ICD-10-CM | POA: Diagnosis not present

## 2024-02-01 DIAGNOSIS — N939 Abnormal uterine and vaginal bleeding, unspecified: Secondary | ICD-10-CM | POA: Diagnosis not present

## 2024-02-01 DIAGNOSIS — R300 Dysuria: Secondary | ICD-10-CM | POA: Diagnosis not present

## 2024-02-01 DIAGNOSIS — Z975 Presence of (intrauterine) contraceptive device: Secondary | ICD-10-CM | POA: Diagnosis not present

## 2024-02-01 DIAGNOSIS — Z01419 Encounter for gynecological examination (general) (routine) without abnormal findings: Secondary | ICD-10-CM | POA: Diagnosis not present

## 2024-02-01 DIAGNOSIS — Z113 Encounter for screening for infections with a predominantly sexual mode of transmission: Secondary | ICD-10-CM | POA: Diagnosis not present

## 2024-02-01 DIAGNOSIS — F17290 Nicotine dependence, other tobacco product, uncomplicated: Secondary | ICD-10-CM | POA: Diagnosis not present

## 2024-02-01 DIAGNOSIS — F17210 Nicotine dependence, cigarettes, uncomplicated: Secondary | ICD-10-CM | POA: Diagnosis not present

## 2024-02-01 DIAGNOSIS — Z124 Encounter for screening for malignant neoplasm of cervix: Secondary | ICD-10-CM | POA: Diagnosis not present
